# Patient Record
Sex: Female | Born: 1950 | Race: Black or African American | Hispanic: No | Smoking: Former smoker
Health system: Southern US, Community
[De-identification: ages and names within clinical notes are randomized; demographics above are authoritative.]

## PROBLEM LIST (undated history)

## (undated) DIAGNOSIS — E119 Type 2 diabetes mellitus without complications: Secondary | ICD-10-CM

## (undated) DIAGNOSIS — I10 Essential (primary) hypertension: Secondary | ICD-10-CM

## (undated) DIAGNOSIS — D649 Anemia, unspecified: Secondary | ICD-10-CM

## (undated) DIAGNOSIS — T7840XA Allergy, unspecified, initial encounter: Secondary | ICD-10-CM

## (undated) DIAGNOSIS — E079 Disorder of thyroid, unspecified: Secondary | ICD-10-CM

## (undated) HISTORY — DX: Allergy, unspecified, initial encounter: T78.40XA

## (undated) HISTORY — DX: Anemia, unspecified: D64.9

## (undated) HISTORY — DX: Disorder of thyroid, unspecified: E07.9

## (undated) HISTORY — PX: SALIVARY GLAND SURGERY: SHX768

## (undated) HISTORY — PX: COLONOSCOPY: SHX174

---

## 1985-02-09 HISTORY — PX: MYOMECTOMY: SHX85

## 2010-12-22 ENCOUNTER — Emergency Department (HOSPITAL_COMMUNITY)
Admission: EM | Admit: 2010-12-22 | Discharge: 2010-12-22 | Disposition: A | Discharge status: Home / Self Care | Payer: Self-pay | Source: Home / Self Care

## 2010-12-22 ENCOUNTER — Encounter: Payer: Self-pay | Admitting: Cardiology

## 2010-12-22 DIAGNOSIS — N760 Acute vaginitis: Secondary | ICD-10-CM

## 2010-12-22 DIAGNOSIS — J069 Acute upper respiratory infection, unspecified: Secondary | ICD-10-CM

## 2010-12-22 HISTORY — DX: Essential (primary) hypertension: I10

## 2010-12-22 LAB — WET PREP, GENITAL: Yeast Wet Prep HPF POC: NONE SEEN

## 2010-12-22 LAB — POCT URINALYSIS DIP (DEVICE)
Bilirubin Urine: NEGATIVE
Glucose, UA: NEGATIVE mg/dL
Ketones, ur: NEGATIVE mg/dL
Leukocytes, UA: NEGATIVE
Nitrite: NEGATIVE

## 2010-12-22 NOTE — ED Notes (Signed)
Pt c/o sore thorat for the past week with blood tinged white sputum. Pt also report vaginal discharge for the past month.

## 2010-12-22 NOTE — ED Provider Notes (Signed)
History     CSN: [id number] Arrival date & time: 12/22/2010  6:09 PM   None     No chief complaint on file.   (Consider location/radiation/quality/duration/timing/severity/associated sxs/prior treatment) HPI Comments: Pt presents with 2 complaints. She c/o sore throat intermittently x one week. States she has had some nasal congestion and post nasal drainage off and on x 2 weeks. May notice it for a day or two then gone for a couple of days. Notices the sore throat intermittently on the days that she has the post nasal drainage and improves when she clears her throat of the phlegm. The phlegm is clear or white and blood streaked. No cough or fever.  Also states that she has been "leaking" x one mos. Thin clear fluid w/o odor. Uncertain if it is a vaginal discharge. Does not seem to occur with cough, laugh sneeze or urge to void. States when she had similar symptoms in the past she had a UTI. No current dysuria, urgency or frequency.   The history is provided by the patient.    No past medical history on file.  No past surgical history on file.  No family history on file.  History  Substance Use Topics  . Smoking status: Not on file  . Smokeless tobacco: Not on file  . Alcohol Use: Not on file    OB History    No data available      Review of Systems  Constitutional: Negative for fever and chills.  HENT: Positive for congestion, sore throat and postnasal drip. Negative for ear pain, rhinorrhea, sneezing, trouble swallowing and voice change.   Respiratory: Negative for cough, chest tightness and shortness of breath.   Cardiovascular: Negative for chest pain.  Gastrointestinal: Negative for nausea, vomiting, diarrhea and constipation.  Genitourinary: Positive for vaginal discharge. Negative for dysuria, urgency, frequency and vaginal pain.    Allergies  Review of patient's allergies indicates not on file.  Home Medications  No current outpatient prescriptions on  file.  BP 160/85  Pulse 65  Temp(Src) 98.6 F (37 C) (Oral)  Resp 16  SpO2 98%  Physical Exam  Nursing note and vitals reviewed. Constitutional: She appears well-developed and well-nourished. No distress.  HENT:  Head: Normocephalic and atraumatic.  Right Ear: Tympanic membrane, external ear and ear canal normal.  Left Ear: Tympanic membrane, external ear and ear canal normal.  Nose: Nose normal.  Mouth/Throat: Uvula is midline, oropharynx is clear and moist and mucous membranes are normal. No oropharyngeal exudate, posterior oropharyngeal edema or posterior oropharyngeal erythema.  Neck: Neck supple.  Cardiovascular: Normal rate, regular rhythm and normal heart sounds.   Pulmonary/Chest: Effort normal and breath sounds normal. No respiratory distress.  Genitourinary: Vagina normal and uterus normal. There is no rash, tenderness or lesion on the right labia. There is no rash, tenderness or lesion on the left labia. Cervix exhibits no motion tenderness, no discharge and no friability. Right adnexum displays no mass, no tenderness and no fullness. Left adnexum displays no mass, no tenderness and no fullness.  Lymphadenopathy:    She has no cervical adenopathy.  Neurological: She is alert.  Skin: Skin is warm and dry.  Psychiatric: She has a normal mood and affect.    ED Course  Procedures (including critical care time)   Labs Reviewed  POCT URINALYSIS DIPSTICK   No results found.   No diagnosis found.    MDM  UA neg. GC/Chlamydia & wet prep pending. GU exam neg. Discussed with  pt that her symptoms may be due to incontinence and not vaginal discharge. Await vag cultures. If cultures neg and symptoms persists to f/u with PCP.         Melody Comas, [place] 12/22/10 Ernestina Columbia

## 2010-12-23 LAB — GC/CHLAMYDIA PROBE AMP, GENITAL
Chlamydia, DNA Probe: NEGATIVE
GC Probe Amp, Genital: NEGATIVE

## 2010-12-23 NOTE — ED Provider Notes (Signed)
Medical screening examination/treatment/procedure(s) were performed by non-physician practitioner and as supervising physician I was immediately available for consultation/collaboration.   [facility]; MD   Sharin Grave, MD 12/23/10 [phone]

## 2010-12-24 ENCOUNTER — Telehealth (HOSPITAL_COMMUNITY): Payer: Self-pay | Admitting: Physician Assistant

## 2010-12-24 MED ORDER — METRONIDAZOLE 500 MG PO TABS: 500.0000 mg | ORAL_TABLET | Freq: Two times a day (BID) | ORAL | Status: AC

## 2011-01-04 NOTE — Telephone Encounter (Signed)
See 12-24-10 documentation of phone encounter with pt.

## 2011-03-08 ENCOUNTER — Emergency Department (HOSPITAL_COMMUNITY)
Admission: EM | Admit: 2011-03-08 | Discharge: 2011-03-08 | Disposition: A | Discharge status: Home / Self Care | Payer: Self-pay | Source: Home / Self Care | Attending: Emergency Medicine | Admitting: Emergency Medicine

## 2011-03-08 ENCOUNTER — Encounter (HOSPITAL_COMMUNITY): Payer: Self-pay

## 2011-03-08 DIAGNOSIS — S335XXA Sprain of ligaments of lumbar spine, initial encounter: Secondary | ICD-10-CM

## 2011-03-08 DIAGNOSIS — R32 Unspecified urinary incontinence: Secondary | ICD-10-CM

## 2011-03-08 DIAGNOSIS — S39012A Strain of muscle, fascia and tendon of lower back, initial encounter: Secondary | ICD-10-CM

## 2011-03-08 LAB — POCT URINALYSIS DIP (DEVICE)
Glucose, UA: NEGATIVE mg/dL
Hgb urine dipstick: NEGATIVE
Protein, ur: NEGATIVE mg/dL
Specific Gravity, Urine: 1.025 (ref 1.005–1.030)
Urobilinogen, UA: 0.2 mg/dL (ref 0.0–1.0)
pH: 5 (ref 5.0–8.0)

## 2011-03-08 LAB — WET PREP, GENITAL: Trich, Wet Prep: NONE SEEN

## 2011-03-08 MED ORDER — TRAMADOL HCL 50 MG PO TABS: 100.0000 mg | ORAL_TABLET | Freq: Three times a day (TID) | ORAL | Status: AC | PRN

## 2011-03-08 MED ORDER — METHOCARBAMOL 500 MG PO TABS: 500.0000 mg | ORAL_TABLET | Freq: Three times a day (TID) | ORAL | Status: AC

## 2011-03-08 MED ORDER — MELOXICAM 15 MG PO TABS: 15.0000 mg | ORAL_TABLET | Freq: Every day | ORAL | Status: AC

## 2011-03-08 NOTE — ED Notes (Signed)
Pt has low back pain, sorethroat and bladder leakage for two weeks.

## 2011-03-08 NOTE — ED Provider Notes (Signed)
History     CSN: [id number]  Arrival date & time 03/08/11  1130   First MD Initiated Contact with Patient 03/08/11 1145      Chief Complaint  Patient presents with  . Back Pain    (Consider location/radiation/quality/duration/timing/severity/associated sxs/prior treatment) HPI Comments: The patient has had a two-week history of right mid lumbar pain without radiation. She denies any injury. The pain is worse with movement, twisting, and bending, and better with ibuprofen. She denies any radiation into the leg, no numbness, tingling, or muscle weakness. She denies any dysuria, frequency, urgency or hematuria. She's had no abdominal pain or fever.  She also has had a two-week history of constant urinary leakage. She denies any vaginal discharge or itching. She denies any stress incontinence. She's had no dysuria, frequency, or urgency.  Patient is a 61 y.o. female presenting with back pain.  Back Pain  Pertinent negatives include no fever, no numbness, no abdominal pain, no dysuria, no pelvic pain and no weakness.    Past Medical History  Diagnosis Date  . Hypertension     Past Surgical History  Procedure Date  . Cesarean section 1988 1991  . Myomectomy 1987    Family History  Problem Relation Age of Onset  . Hypertension Mother   . Hypertension Father   . Heart disease Other   . Diabetes Maternal Aunt   . Heart disease Maternal Uncle     History  Substance Use Topics  . Smoking status: Current Everyday Smoker    Types: Cigarettes  . Smokeless tobacco: Not on file  . Alcohol Use: Yes     occas    OB History    Grav Para Term Preterm Abortions TAB SAB Ect Mult Living                  Review of Systems  Constitutional: Negative for fever, chills and unexpected weight change.  Gastrointestinal: Negative for nausea, vomiting, abdominal pain and diarrhea.  Genitourinary: Negative for dysuria, urgency, frequency, hematuria, vaginal bleeding, vaginal discharge,  difficulty urinating, genital sores, vaginal pain, menstrual problem, pelvic pain and dyspareunia.  Musculoskeletal: Positive for back pain. Negative for myalgias, joint swelling, arthralgias and gait problem.  Neurological: Negative for weakness and numbness.    Allergies  Review of patient's allergies indicates no known allergies.  Home Medications   Current Outpatient Rx  Name Route Sig Dispense Refill  . LEVOTHYROXINE SODIUM 50 MCG PO TABS Oral Take 50 mcg by mouth daily.      Marland Kitchen LOSARTAN POTASSIUM 50 MG PO TABS Oral Take 50 mg by mouth daily.      . MELOXICAM 15 MG PO TABS Oral Take 1 tablet (15 mg total) by mouth daily. 15 tablet 0  . METHOCARBAMOL 500 MG PO TABS Oral Take 1 tablet (500 mg total) by mouth 3 (three) times daily. 30 tablet 0  . RANITIDINE HCL 150 MG PO TABS Oral Take 150 mg by mouth daily.      . TRAMADOL HCL 50 MG PO TABS Oral Take 2 tablets (100 mg total) by mouth every 8 (eight) hours as needed for pain. 30 tablet 0    BP 143/78  Pulse 72  Temp(Src) 98.4 F (36.9 C) (Oral)  Resp 14  SpO2 99%  Physical Exam  Nursing note and vitals reviewed. Constitutional: She is oriented to person, place, and time. She appears well-developed and well-nourished.  Abdominal: Soft. Bowel sounds are normal. She exhibits no distension, no abdominal bruit, no  pulsatile midline mass and no mass. There is no tenderness. There is no rebound and no guarding.  Genitourinary:       Pelvic exam reveals normal external genitalia. There was no urine leakage from the urethra either spontaneously or with coughing or straining. Vaginal mucosa appears normal without any discharge, drainage, or urinary leakage. The cervix was normal. Uterus was midposition, normal in size and shape and nontender. There were no adnexal masses or tenderness.  Musculoskeletal: She exhibits no edema and no tenderness.       Lumbar back: She exhibits decreased range of motion, tenderness, bony tenderness and pain.  She exhibits no swelling, no edema, no deformity, no spasm and normal pulse.  Neurological: She is alert and oriented to person, place, and time. She has normal reflexes. She displays no atrophy. No sensory deficit. She exhibits normal muscle tone. Coordination and gait normal.       Exam of the back reveals tenderness to palpation in the right mid lumbar area. The back had a full range of motion with slight pain on forward bending. Straight leg raising was negative.  Skin: Skin is warm and dry. No rash noted. She is not diaphoretic.    ED Course  Procedures (including critical care time)  Results for orders placed during the hospital encounter of 03/08/11  POCT URINALYSIS DIP (DEVICE)      Component Value Range   Glucose, UA NEGATIVE  NEGATIVE (mg/dL)   Bilirubin Urine NEGATIVE  NEGATIVE    Ketones, ur NEGATIVE  NEGATIVE (mg/dL)   Specific Gravity, Urine 1.025  1.005 - 1.030    Hgb urine dipstick NEGATIVE  NEGATIVE    pH 5.0  5.0 - 8.0    Protein, ur NEGATIVE  NEGATIVE (mg/dL)   Urobilinogen, UA 0.2  0.0 - 1.0 (mg/dL)   Nitrite NEGATIVE  NEGATIVE    Leukocytes, UA NEGATIVE  NEGATIVE   WET PREP, GENITAL      Component Value Range   Yeast, Wet Prep NONE SEEN  NONE SEEN    Trich, Wet Prep NONE SEEN  NONE SEEN    Clue Cells, Wet Prep FEW (*) NONE SEEN    WBC, Wet Prep HPF POC MANY (*) NONE SEEN      Labs Reviewed  WET PREP, GENITAL - Abnormal; Notable for the following:    Clue Cells, Wet Prep FEW (*)    WBC, Wet Prep HPF POC MANY (*)    All other components within normal limits  POCT URINALYSIS DIP (DEVICE)  POCT URINALYSIS DIPSTICK  GC/CHLAMYDIA PROBE AMP, GENITAL   No results found.   1. Lumbar strain   2. Urinary incontinence       MDM  She appears to have a lumbar strain and was begun on back exercises, meloxicam, tramadol, and Robaxin.  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 She was referred to Dr. Ezzie Dural for further  evaluation.        Roque Lias, MD 03/08/11 [phone]

## 2011-03-09 LAB — GC/CHLAMYDIA PROBE AMP, GENITAL
Chlamydia, DNA Probe: NEGATIVE
GC Probe Amp, Genital: NEGATIVE

## 2012-04-12 ENCOUNTER — Ambulatory Visit: Payer: BC Managed Care – PPO

## 2012-04-12 ENCOUNTER — Ambulatory Visit (INDEPENDENT_AMBULATORY_CARE_PROVIDER_SITE_OTHER): Payer: BC Managed Care – PPO | Admitting: Family Medicine

## 2012-04-12 VITALS — BP 110/77 | HR 67 | Temp 98.1°F | Resp 16 | Ht 64.25 in | Wt 202.0 lb

## 2012-04-12 DIAGNOSIS — M25562 Pain in left knee: Secondary | ICD-10-CM

## 2012-04-12 DIAGNOSIS — M25569 Pain in unspecified knee: Secondary | ICD-10-CM

## 2012-04-12 DIAGNOSIS — S8990XA Unspecified injury of unspecified lower leg, initial encounter: Secondary | ICD-10-CM

## 2012-04-12 DIAGNOSIS — S99929A Unspecified injury of unspecified foot, initial encounter: Secondary | ICD-10-CM

## 2012-04-12 DIAGNOSIS — S8992XA Unspecified injury of left lower leg, initial encounter: Secondary | ICD-10-CM

## 2012-04-12 DIAGNOSIS — J069 Acute upper respiratory infection, unspecified: Secondary | ICD-10-CM

## 2012-04-12 MED ORDER — IPRATROPIUM BROMIDE 0.03 % NA SOLN: 2.0000 | Freq: Two times a day (BID) | NASAL | Status: DC

## 2012-04-12 MED ORDER — FEXOFENADINE HCL 180 MG PO TABS: 180.0000 mg | ORAL_TABLET | Freq: Every day | ORAL | Status: DC

## 2012-04-12 MED ORDER — MELOXICAM 15 MG PO TABS: 15.0000 mg | ORAL_TABLET | Freq: Every day | ORAL | Status: DC

## 2012-04-12 NOTE — Patient Instructions (Addendum)
Begin taking Allegra once daily (this should not make you sleepy), it lasts 24 hours.  Also begin using Atrovent nasal spray twice daily for relief of congestion and post-nasal drainage.  Plenty of fluids and rest.  Continue gargles to help with throat irritation.  Mobic (meloxicam) once daily for knee pain.  Ice and elevate when possible.  If no improvement in the next 2 weeks, please let us know.   Upper Respiratory Infection, Adult An upper respiratory infection (URI) is also sometimes known as the common cold. The upper respiratory tract includes the nose, sinuses, throat, trachea, and bronchi. Bronchi are the airways leading to the lungs. Most people improve within 1 week, but symptoms can last up to 2 weeks. A residual cough may last even longer.  CAUSES Many different viruses can infect the tissues lining the upper respiratory tract. The tissues become irritated and inflamed and often become very moist. Mucus production is also common. A cold is contagious. You can easily spread the virus to others by oral contact. This includes kissing, sharing a glass, coughing, or sneezing. Touching your mouth or nose and then touching a surface, which is then touched by another person, can also spread the virus. SYMPTOMS  Symptoms typically develop 1 to 3 days after you come in contact with a cold virus. Symptoms vary from person to person. They may include:  Runny nose.  Sneezing.  Nasal congestion.  Sinus irritation.  Sore throat.  Loss of voice (laryngitis).  Cough.  Fatigue.  Muscle aches.  Loss of appetite.  Headache.  Low-grade fever. DIAGNOSIS  You might diagnose your own cold based on familiar symptoms, since most people get a cold 2 to 3 times a year. Your caregiver can confirm this based on your exam. Most importantly, your caregiver can check that your symptoms are not due to another disease such as strep throat, sinusitis, pneumonia, asthma, or epiglottitis. Blood tests,  throat tests, and X-rays are not necessary to diagnose a common cold, but they may sometimes be helpful in excluding other more serious diseases. Your caregiver will decide if any further tests are required. RISKS AND COMPLICATIONS  You may be at risk for a more severe case of the common cold if you smoke cigarettes, have chronic heart disease (such as heart failure) or lung disease (such as asthma), or if you have a weakened immune system. The very young and very old are also at risk for more serious infections. Bacterial sinusitis, middle ear infections, and bacterial pneumonia can complicate the common cold. The common cold can worsen asthma and chronic obstructive pulmonary disease (COPD). Sometimes, these complications can require emergency medical care and may be life-threatening. PREVENTION  The best way to protect against getting a cold is to practice good hygiene. Avoid oral or hand contact with people with cold symptoms. Wash your hands often if contact occurs. There is no clear evidence that vitamin C, vitamin E, echinacea, or exercise reduces the chance of developing a cold. However, it is always recommended to get plenty of rest and practice good nutrition. TREATMENT  Treatment is directed at relieving symptoms. There is no cure. Antibiotics are not effective, because the infection is caused by a virus, not by bacteria. Treatment may include:  Increased fluid intake. Sports drinks offer valuable electrolytes, sugars, and fluids.  Breathing heated mist or steam (vaporizer or shower).  Eating chicken soup or other clear broths, and maintaining good nutrition.  Getting plenty of rest.  Using gargles or lozenges for  comfort.  Controlling fevers with ibuprofen or acetaminophen as directed by your caregiver.  Increasing usage of your inhaler if you have asthma. Zinc gel and zinc lozenges, taken in the first 24 hours of the common cold, can shorten the duration and lessen the severity of  symptoms. Pain medicines may help with fever, muscle aches, and throat pain. A variety of non-prescription medicines are available to treat congestion and runny nose. Your caregiver can make recommendations and may suggest nasal or lung inhalers for other symptoms.  HOME CARE INSTRUCTIONS   Only take over-the-counter or prescription medicines for pain, discomfort, or fever as directed by your caregiver.  Use a warm mist humidifier or inhale steam from a shower to increase air moisture. This may keep secretions moist and make it easier to breathe.  Drink enough water and fluids to keep your urine clear or pale yellow.  Rest as needed.  Return to work when your temperature has returned to normal or as your caregiver advises. You may need to stay home longer to avoid infecting others. You can also use a face mask and careful hand washing to prevent spread of the virus. SEEK MEDICAL CARE IF:   After the first few days, you feel you are getting worse rather than better.  You need your caregiver's advice about medicines to control symptoms.  You develop chills, worsening shortness of breath, or brown or red sputum. These may be signs of pneumonia.  You develop yellow or brown nasal discharge or pain in the face, especially when you bend forward. These may be signs of sinusitis.  You develop a fever, swollen neck glands, pain with swallowing, or white areas in the back of your throat. These may be signs of strep throat. SEEK IMMEDIATE MEDICAL CARE IF:   You have a fever.  You develop severe or persistent headache, ear pain, sinus pain, or chest pain.  You develop wheezing, a prolonged cough, cough up blood, or have a change in your usual mucus (if you have chronic lung disease).  You develop sore muscles or a stiff neck. Document Released: 07/22/2000 Document Revised: 04/20/2011 Document Reviewed: 05/30/2010 [facility] Patient Information 2013 [place], [place].

## 2012-04-12 NOTE — Progress Notes (Signed)
Xray read and patient discussed with Ms. Egan. Agree with assessment and plan of care per her note.   

## 2012-04-12 NOTE — Progress Notes (Signed)
Subjective:    Patient ID: Alexandria Walsh, female    DOB: 1950/08/14, 62 y.o.   MRN: [id number]  HPI   Alexandria Walsh is a very pleasant 62 yr old female here with two concerns.  (1)  URI symptoms for 3-4 days.  Experiencing sore throat, runny/stuffy nose, and sneezing for 3-4 days.  No cough or headache.  Ears feel a little full sometimes.  Some sinus pressure and post-nasal drainage.  No fever or chills.  She has been using Catering manager for symptoms with little relief.    (2)  "I fell and hurt my leg about a week ago"  States she was walking and tripped over someone's foot.  Landed on the left knee cap.  Intermittent swelling.  Has been wearing a brace occasionally.  She is able to bear weight and walk, but the knee feels "tight", esp when she first stands up.  Elevating when possible but not using ice.  When asked to localize pain, she points to the patella.  States she no better than after the injury, but also does not feel like she is worsening.  Taking Aleve intermittently for pain relief.      Review of Systems  Constitutional: Negative for fever and chills.  HENT: Positive for congestion, sore throat, rhinorrhea, sneezing and sinus pressure. Negative for ear pain.   Respiratory: Negative for cough, shortness of breath and wheezing.   Cardiovascular: Negative.   Gastrointestinal: Negative.   Musculoskeletal: Positive for arthralgias (left knee).  Skin: Negative.   Neurological: Negative.        Objective:   Physical Exam  Vitals reviewed. Constitutional: She is oriented to person, place, and time. She appears well-developed and well-nourished. No distress.  HENT:  Head: Normocephalic and atraumatic.  Right Ear: Ear canal normal. Tympanic membrane is injected.  Left Ear: Tympanic membrane and ear canal normal.  Nose: Mucosal edema and rhinorrhea present. Right sinus exhibits no maxillary sinus tenderness and no frontal sinus tenderness. Left sinus exhibits no maxillary sinus  tenderness and no frontal sinus tenderness.  Mouth/Throat: Uvula is midline, oropharynx is clear and moist and mucous membranes are normal.  Eyes: Conjunctivae are normal. No scleral icterus.  Neck: Neck supple.  Cardiovascular: Normal rate, regular rhythm and normal heart sounds.  Exam reveals no gallop and no friction rub.   No murmur heard. Pulmonary/Chest: Effort normal and breath sounds normal. She has no wheezes. She has no rales.  Musculoskeletal:       Right knee: Normal.       Left knee: She exhibits swelling and effusion. She exhibits normal range of motion, no ecchymosis, no deformity, no erythema, normal alignment, no LCL laxity, normal patellar mobility, no bony tenderness and no MCL laxity.       Right ankle: Normal.       Left ankle: Normal.       Right lower leg: Normal.       Left lower leg: Normal. She exhibits no tenderness, no swelling and no edema.  Lymphadenopathy:    She has no cervical adenopathy.  Neurological: She is alert and oriented to person, place, and time.  Skin: Skin is warm and dry.  Psychiatric: She has a normal mood and affect. Her behavior is normal.     Filed Vitals:   04/12/12 1445  BP: 110/77  Pulse: 67  Temp: 98.1 F (36.7 C)  Resp: 16     [facility] reading (PRIMARY) by  Dr. Neva Seat - no fracture; narrowing of  the medial joint space; questionable soft tissue density in the posterior knee      Assessment & Plan:  (1) Knee pain, left - Plan: DG Knee Complete 4 Views Right, meloxicam (MOBIC) 15 MG tablet  -- Pt s/p fall on left flexed knee approx 1 wk ago.  X-ray of knee shows no fracture.  Will continue conservative treatment.  Mobic once daily for at least the next 2 weeks.  Ice and elevation when possible.  Activity as tolerated.  If no improvement in 2-3 weeks, will consider PT or ortho eval.  Pt to RTC if worsening.  (2) Knee injury, left, initial encounter - Plan: DG Knee Complete 4 Views Right  -- See above  (3) Acute upper  respiratory infections of unspecified site - Plan: fexofenadine (ALLEGRA) 180 MG tablet, ipratropium (ATROVENT) 0.03 % nasal spray  --  Pt with 3-4 days of URI symptoms, suspect viral etiology.  VSS, afebrile, lungs CTA, no sinus tenderness.  Will treat symptoms with Allegra and Atrovent.  Plenty of fluids and rest.  Discussed RTC precautions.  Pt expressed understanding and is in agreement with this plan.

## 2013-02-05 ENCOUNTER — Ambulatory Visit: Payer: BC Managed Care – PPO

## 2013-02-05 ENCOUNTER — Ambulatory Visit (INDEPENDENT_AMBULATORY_CARE_PROVIDER_SITE_OTHER): Payer: BC Managed Care – PPO | Admitting: Family Medicine

## 2013-02-05 VITALS — BP 116/82 | HR 69 | Temp 98.8°F | Resp 16 | Ht 64.0 in | Wt 197.0 lb

## 2013-02-05 DIAGNOSIS — R0602 Shortness of breath: Secondary | ICD-10-CM

## 2013-02-05 DIAGNOSIS — M79605 Pain in left leg: Secondary | ICD-10-CM

## 2013-02-05 DIAGNOSIS — M79609 Pain in unspecified limb: Secondary | ICD-10-CM

## 2013-02-05 DIAGNOSIS — J069 Acute upper respiratory infection, unspecified: Secondary | ICD-10-CM

## 2013-02-05 LAB — POCT CBC
Granulocyte percent: 51.9 %G (ref 37–80)
HCT, POC: 44.3 % (ref 37.7–47.9)
MCV: 95.6 fL (ref 80–97)
POC LYMPH PERCENT: 41.2 %L (ref 10–50)
RDW, POC: 13.7 %
WBC: 5.9 10*3/uL (ref 4.6–10.2)

## 2013-02-05 MED ORDER — IPRATROPIUM BROMIDE 0.03 % NA SOLN: 2.0000 | Freq: Two times a day (BID) | NASAL | Status: DC

## 2013-02-05 MED ORDER — DICLOFENAC SODIUM 75 MG PO TBEC: 75.0000 mg | DELAYED_RELEASE_TABLET | Freq: Two times a day (BID) | ORAL | Status: DC

## 2013-02-05 NOTE — Patient Instructions (Signed)
Take Claritin-D (loratadine D.) 1 each evening to try and keep you had open. This is non-prescription  Use the ipratropium nose spray 2 sprays in each nostril at bedtime. Can use again in 6 hours if needed.  Use a humidifier or cool mist vaporizer by your bed side  Diclofenac one twice daily for the leg pain. This is possibly caused by a Baker's cyst. If he keeps giving problems we'll need to referred to an orthopedic doctor. We might get an ultrasound of your leg first.  Return if worse or not improving.  If the breathing is at all worse go to the emergency room.

## 2013-02-05 NOTE — Progress Notes (Signed)
Subjective: Patient has been having a couple of problems. She's been having pain behind the calf of her left knee. There is also some tightness and pain in the upper and lower parts of the leg, but it is mostly behind the knee is bothering her. This been going on for couple of weeks. Knows of no particular trauma. Also she is having problems breathing at night. She has to prop up to breathe. It seems that this is mostly because she cannot breathe through her nose. It does not seem to be a chest septum.  Objective: Pleasant lady in no major distress. Nose congested. TMs normal. Throat clear. Neck supple without nodes. No JVD. Chest clear to auscultation. Heart regular without murmurs. Abdomen soft nontender. She's tender on the knee of her left leg in the popliteal fossa. There is a possible Baker's cyst though it is only slightly larger than the other day. No significant ankle edema. Negative Homans sign. No medial thigh tenderness the she feels tight there.  She also complains of a tingling numbness pain into her right hand intermittently. This appears grossly normal. I'm not sure what that is from.  Results for orders placed in visit on 02/05/13  POCT CBC      Result Value Range   WBC 5.9  4.6 - 10.2 K/uL   Lymph, poc 2.4  0.6 - 3.4   POC LYMPH PERCENT 41.2  10 - 50 %L   MID (cbc) 0.4  0 - 0.9   POC MID % 6.9  0 - 12 %M   POC Granulocyte 3.1  2 - 6.9   Granulocyte percent 51.9  37 - 80 %G   RBC 4.63  4.04 - 5.48 M/uL   Hemoglobin 13.4  12.2 - 16.2 g/dL   HCT, POC 08.6  57.8 - 47.9 %   MCV 95.6  80 - 97 fL   MCH, POC 28.9  27 - 31.2 pg   MCHC 30.2 (*) 31.8 - 35.4 g/dL   RDW, POC 46.9     Platelet Count, POC 221  142 - 424 K/uL   MPV 10.2  0 - 99.8 fL   [facility] reading (PRIMARY) by  Dr. Alwyn Ren normax cxr  Assessment: Leg pain from possible Baker's cyst. We will check a d-dimer. This been going on for a while, but if it is elevated we'll need to get an ultrasound. If the pain continues to  persist will refer to orthopedist If the right hand symptoms get worse she should return for recheck Nasal congestion  Plan: Atrovent nasal Diclofenac for the leg Return if worse

## 2013-10-24 ENCOUNTER — Ambulatory Visit (INDEPENDENT_AMBULATORY_CARE_PROVIDER_SITE_OTHER): Payer: BC Managed Care – PPO | Admitting: Physician Assistant

## 2013-10-24 VITALS — BP 112/68 | HR 70 | Temp 97.8°F | Resp 18 | Ht 63.25 in | Wt 193.4 lb

## 2013-10-24 DIAGNOSIS — J029 Acute pharyngitis, unspecified: Secondary | ICD-10-CM

## 2013-10-24 DIAGNOSIS — Z9109 Other allergy status, other than to drugs and biological substances: Secondary | ICD-10-CM

## 2013-10-24 DIAGNOSIS — E039 Hypothyroidism, unspecified: Secondary | ICD-10-CM | POA: Insufficient documentation

## 2013-10-24 DIAGNOSIS — K219 Gastro-esophageal reflux disease without esophagitis: Secondary | ICD-10-CM | POA: Insufficient documentation

## 2013-10-24 DIAGNOSIS — I10 Essential (primary) hypertension: Secondary | ICD-10-CM | POA: Insufficient documentation

## 2013-10-24 LAB — POCT RAPID STREP A (OFFICE): Rapid Strep A Screen: NEGATIVE

## 2013-10-24 MED ORDER — GUAIFENESIN ER 1200 MG PO TB12: 1.0000 | ORAL_TABLET | Freq: Two times a day (BID) | ORAL | Status: AC

## 2013-10-24 MED ORDER — FLUTICASONE PROPIONATE 50 MCG/ACT NA SUSP: 2.0000 | Freq: Every day | NASAL | Status: DC

## 2013-10-24 NOTE — Progress Notes (Signed)
   Subjective:    Patient ID: Alexandria Walsh, female    DOB: 02-Aug-1950, 63 y.o.   MRN: [id number]  HPI Pt presents to clinic with sore throat and slight congestion for the last week or so.  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  The sore throat is worse in the am but does not completely resolve during the day.  She has a cough but it is dry and expect in the morning when the mucus that she coughs up is thick.  She has h/o fall allergies.  OTC meds - delsym, dayquil Sick contacts - people at work are sick  Review of Systems  HENT: Positive for congestion, ear pain (slight on the left), postnasal drip and sore throat (slightly improved after lunch but never resolves throughout the day). Negative for rhinorrhea.   Respiratory: Positive for cough (productivein the am).   Gastrointestinal: Negative for nausea, vomiting and diarrhea.  Musculoskeletal: Negative for myalgias.  Neurological: Negative for headaches.       Objective:   Physical Exam  Vitals reviewed. Constitutional: She is oriented to person, place, and time. She appears well-developed and well-nourished.  HENT:  Head: Normocephalic and atraumatic.  Right Ear: Hearing, tympanic membrane, external ear and ear canal normal.  Left Ear: Hearing, tympanic membrane, external ear and ear canal normal.  Nose: Mucosal edema (pale and swollen) present.  Mouth/Throat: Uvula is midline, oropharynx is clear and moist and mucous membranes are normal.  Eyes: Conjunctivae are normal.  Neck: Normal range of motion.  Cardiovascular: Normal rate, regular rhythm and normal heart sounds.   No murmur heard. Pulmonary/Chest: Effort normal and breath sounds normal. She has no wheezes.  Lymphadenopathy:       Head (right side): No tonsillar and no occipital adenopathy present.       Head (left side): No tonsillar and no occipital adenopathy present.    She has cervical adenopathy.       Right cervical: Superficial cervical (mild  TTP) adenopathy present.       Left cervical: Superficial cervical (mild TTP bilaterally) adenopathy present.       Right: No supraclavicular adenopathy present.       Left: No supraclavicular adenopathy present.  Neurological: She is alert and oriented to person, place, and time.  Skin: Skin is warm and dry.  Psychiatric: She has a normal mood and affect. Her behavior is normal. Judgment and thought content normal.   Results for orders placed in visit on 10/24/13  POCT RAPID STREP A (OFFICE)      Result Value Ref Range   Rapid Strep A Screen Negative  Negative        Assessment & Plan:  Sore throat - Plan: POCT rapid strep A, Guaifenesin (MUCINEX MAXIMUM STRENGTH) 1200 MG TB12  Environmental allergies - Plan: fluticasone (FLONASE) 50 MCG/ACT nasal spray  Pt is mostly suffering from uncontrolled allergies.  We will add Flonase and she will use Atrovent prn for runny nose.  She will increase her fluid intake.  Windell Hummingbird PA-C  [facility] 10/24/2013 11:07 AM

## 2014-03-18 ENCOUNTER — Ambulatory Visit (INDEPENDENT_AMBULATORY_CARE_PROVIDER_SITE_OTHER): Payer: Self-pay

## 2014-03-18 ENCOUNTER — Ambulatory Visit (INDEPENDENT_AMBULATORY_CARE_PROVIDER_SITE_OTHER): Payer: Self-pay | Admitting: Internal Medicine

## 2014-03-18 VITALS — BP 126/74 | HR 50 | Temp 97.7°F | Resp 19 | Ht 63.5 in | Wt 197.0 lb

## 2014-03-18 DIAGNOSIS — M25562 Pain in left knee: Secondary | ICD-10-CM

## 2014-03-18 DIAGNOSIS — L03116 Cellulitis of left lower limb: Secondary | ICD-10-CM

## 2014-03-18 MED ORDER — DOXYCYCLINE HYCLATE 100 MG PO TABS: 100.0000 mg | ORAL_TABLET | Freq: Two times a day (BID) | ORAL | Status: DC

## 2014-03-18 NOTE — Progress Notes (Addendum)
Subjective:  This chart was scribed for Alexandria Lin, MD by Dellis Filbert, ED Scribe at [facility].The patient was seen in exam room 14 and the patient's care was started at 11:36 AM.   Patient ID: Alexandria Walsh, female    DOB: 1950-09-18, 64 y.o.   MRN: [id number] Chief Complaint  Patient presents with  . Leg Pain    left leg, x 1 week, pain while walking, swelling   HPI  HPI Comments: Alexandria Walsh is a 64 y.o. female who presents to [facility] complaining of left leg pain, onset 1 week ago. Feels tight like a balloon. Last Sunday she developed left leg pain shortly after church. She wore heels and she typically wears flats to church. Unsure of trauma. To relieve the pain she wrapped, iced and took ibuprofen. Despite this she has continued with swelling anteriorly along the lower leg . The pain is affecting her gait, she currently walks with a limp and has noticed some redness and swelling. No knee pain.  Patient Active Problem List   Diagnosis Date Noted  . HTN (hypertension) 10/24/2013  . Unspecified hypothyroidism 10/24/2013  . Esophageal reflux 10/24/2013   Past Medical History  Diagnosis Date  . Hypertension    Past Surgical History  Procedure Laterality Date  . Cesarean section  1988 1991  . Myomectomy  1987   No Known Allergies Prior to Admission medications   Medication Sig Start Date End Date Taking? Authorizing Provider  amLODipine (NORVASC) 10 MG tablet Take 10 mg by mouth daily.   Yes Historical Provider, MD  calcium-vitamin D (OSCAL WITH D) 500-200 MG-UNIT per tablet Take 1 tablet by mouth.   Yes Historical Provider, MD  fluticasone (FLONASE) 50 MCG/ACT nasal spray Place 2 sprays into both nostrils daily. 10/24/13  Yes Mancel Bale, PA-C  hydrochlorothiazide (HYDRODIURIL) 50 MG tablet Take 50 mg by mouth daily.   Yes Historical Provider, MD  ipratropium (ATROVENT) 0.03 % nasal spray Place 2 sprays into the nose 2 (two) times daily. 02/05/13  Yes  Posey Boyer, MD  levothyroxine (SYNTHROID, LEVOTHROID) 50 MCG tablet Take 50 mcg by mouth daily.     Yes Historical Provider, MD  omeprazole (PRILOSEC) 20 MG capsule Take 20 mg by mouth daily.   Yes Historical Provider, MD   Review of Systems  Musculoskeletal: Positive for joint swelling, arthralgias and gait problem.       Objective:  BP 126/74 mmHg  Pulse 50  Temp(Src) 97.7 F (36.5 C) (Oral)  Resp 19  Ht 5' 3.5" (1.613 m)  Wt 197 lb (89.359 kg)  BMI 34.35 kg/m2  SpO2 98%  Physical Exam  Constitutional: She is oriented to person, place, and time. She appears well-developed and well-nourished. No distress.  HENT:  Head: Normocephalic and atraumatic.  Eyes: Pupils are equal, round, and reactive to light.  Neck: Normal range of motion.  Cardiovascular: Normal rate and regular rhythm.   Pulmonary/Chest: Effort normal. No respiratory distress.  Musculoskeletal: Normal range of motion.  She is swollen along the anterior tibia the lower half of the left extremity with a 2 cm x 2 cm area of mild redness with some heat. It's tender to palpation all around this area There are some bony tenderness there The cath is intact without swelling masses defects or tender cords Dorsiflexion of the foot intact without pain Knee exam is normal  Neurological: She is alert and oriented to person, place, and time.  Skin: Skin  is warm and dry.  Psychiatric: She has a normal mood and affect. Her behavior is normal.  Nursing note and vitals reviewed. [facility] reading (PRIMARY) by  Dr. Laney Pastor no bony abnormalities      Assessment & Plan:  Pain in leg, left - Plan: DG Tibia/Fibula Left  Cellulitis of leg, left  probably secondary to contusion  Elevate/no work off feet for 2 days/heat Doxycycline 100 mg twice a day for 7 days Follow-up one week if not well  I have completed the patient encounter in its entirety as documented by the scribe, with editing by me where necessary. Arlayne Liggins P.  Laney Pastor, M.D.

## 2015-09-06 ENCOUNTER — Ambulatory Visit (INDEPENDENT_AMBULATORY_CARE_PROVIDER_SITE_OTHER): Payer: BLUE CROSS/BLUE SHIELD | Admitting: Urgent Care

## 2015-09-06 VITALS — BP 138/88 | HR 60 | Temp 98.4°F | Resp 17 | Ht 65.0 in | Wt 192.0 lb

## 2015-09-06 DIAGNOSIS — R202 Paresthesia of skin: Secondary | ICD-10-CM | POA: Diagnosis not present

## 2015-09-06 DIAGNOSIS — M79605 Pain in left leg: Secondary | ICD-10-CM | POA: Diagnosis not present

## 2015-09-06 DIAGNOSIS — M79604 Pain in right leg: Secondary | ICD-10-CM

## 2015-09-06 DIAGNOSIS — R7303 Prediabetes: Secondary | ICD-10-CM | POA: Diagnosis not present

## 2015-09-06 DIAGNOSIS — R200 Anesthesia of skin: Secondary | ICD-10-CM

## 2015-09-06 DIAGNOSIS — I10 Essential (primary) hypertension: Secondary | ICD-10-CM | POA: Diagnosis not present

## 2015-09-06 DIAGNOSIS — E669 Obesity, unspecified: Secondary | ICD-10-CM

## 2015-09-06 DIAGNOSIS — N3090 Cystitis, unspecified without hematuria: Secondary | ICD-10-CM

## 2015-09-06 DIAGNOSIS — R350 Frequency of micturition: Secondary | ICD-10-CM | POA: Diagnosis not present

## 2015-09-06 LAB — POCT URINALYSIS DIP (MANUAL ENTRY)
BILIRUBIN UA: NEGATIVE
BILIRUBIN UA: NEGATIVE
Glucose, UA: NEGATIVE
Nitrite, UA: NEGATIVE
PH UA: 7.5
SPEC GRAV UA: 1.015
Urobilinogen, UA: 4

## 2015-09-06 LAB — POCT CBC
Granulocyte percent: 56.4 %G (ref 37–80)
HCT, POC: 39.9 % (ref 37.7–47.9)
HEMOGLOBIN: 13.8 g/dL (ref 12.2–16.2)
LYMPH, POC: 2.4 (ref 0.6–3.4)
MCH, POC: 30.9 pg (ref 27–31.2)
MCHC: 34.7 g/dL (ref 31.8–35.4)
MCV: 89 fL (ref 80–97)
MID (cbc): 0.3 (ref 0–0.9)
MPV: 8.3 fL (ref 0–99.8)
PLATELET COUNT, POC: 158 10*3/uL (ref 142–424)
POC Granulocyte: 3.5 (ref 2–6.9)
POC LYMPH %: 38.3 % (ref 10–50)
POC MID %: 5.3 %M (ref 0–12)
RBC: 4.48 M/uL (ref 4.04–5.48)
RDW, POC: 14.2 %
WBC: 6.2 10*3/uL (ref 4.6–10.2)

## 2015-09-06 LAB — POCT GLYCOSYLATED HEMOGLOBIN (HGB A1C): HEMOGLOBIN A1C: 6.2

## 2015-09-06 LAB — POC MICROSCOPIC URINALYSIS (UMFC): MUCUS RE: ABSENT

## 2015-09-06 MED ORDER — NITROFURANTOIN MONOHYD MACRO 100 MG PO CAPS: 100.0000 mg | ORAL_CAPSULE | Freq: Two times a day (BID) | ORAL | 0 refills | Status: DC

## 2015-09-06 MED ORDER — NAPROXEN SODIUM 550 MG PO TABS: 550.0000 mg | ORAL_TABLET | Freq: Two times a day (BID) | ORAL | 1 refills | Status: DC

## 2015-09-06 MED ORDER — AMLODIPINE BESYLATE 5 MG PO TABS: 5.0000 mg | ORAL_TABLET | Freq: Every day | ORAL | 3 refills | Status: DC

## 2015-09-06 MED ORDER — METFORMIN HCL 500 MG PO TABS: 500.0000 mg | ORAL_TABLET | Freq: Every day | ORAL | 3 refills | Status: AC

## 2015-09-06 NOTE — Progress Notes (Signed)
MRN: [id number] DOB: 1951-01-21  Subjective:   Alexandria Walsh is a 65 y.o. female presenting for chief complaint of Leg Pain and Other (urine frequency )  Urinary Frequency - Reports several month history of urinary frequency, nocturia every hour in the past 2 weeks, has much darker urine. Admits polydipsia. Has not tried medications. Diet is not healthy, does not hydrate well, drinks juice, sweet tea. Denies fever, n/v, abdominal pain, flank pain, pelvic pressure, dysuria, hematuria, vaginal irritation, genital rashes.   Leg Pain - Reports 2 week history of left leg cramping between her inner thigh and calves bilaterally. Admits history of varicose veins, does not use compression stockings.   [name] has a current medication list which includes the following prescription(s): levothyroxine, meloxicam, and montelukast. Also has No Known Allergies.  [name]  has a past medical history of Hypertension. Also  has a past surgical history that includes Cesarean section (1988 1991) and Myomectomy (1987).  Objective:   Vitals: BP 138/88 (BP Location: Right Arm, Patient Position: Sitting, Cuff Size: Normal)   Pulse 60   Temp 98.4 F (36.9 C) (Oral)   Resp 17   Ht 5\' 5"  (1.651 m)   Wt 192 lb (87.1 kg)   SpO2 98%   BMI 31.95 kg/m   Physical Exam  Constitutional: She is oriented to person, place, and time. She appears well-developed and well-nourished.  HENT:  Mouth/Throat: Oropharynx is clear and moist.  Cardiovascular: Normal rate, regular rhythm and intact distal pulses.  Exam reveals no gallop and no friction rub.   No murmur heard. Pulmonary/Chest: No respiratory distress. She has no wheezes. She has no rales.  Abdominal: Soft. Bowel sounds are normal. She exhibits no distension and no mass. There is no tenderness. There is no guarding.  Musculoskeletal: She exhibits edema (trace up to mid calves bilaterally).       Right lower leg: She exhibits no tenderness, no bony tenderness, no  swelling, no edema, no deformity and no laceration.       Left lower leg: She exhibits no tenderness, no bony tenderness, no swelling, no edema, no deformity and no laceration.       Legs: Neurological: She is alert and oriented to person, place, and time.  Skin: Skin is warm and dry.   Results for orders placed or performed in visit on 09/06/15 (from the past 24 hour(s))  POCT Microscopic Urinalysis ([facility])     Status: Abnormal   Collection Time: 09/06/15  5:21 PM  Result Value Ref Range   WBC,UR,HPF,POC Few (A) None WBC/hpf   RBC,UR,HPF,POC Few (A) None RBC/hpf   Bacteria Few (A) None, Too numerous to count   Mucus Absent Absent   Epithelial Cells, UR Per Microscopy Few (A) None, Too numerous to count cells/hpf  POCT urinalysis dipstick     Status: Abnormal   Collection Time: 09/06/15  5:22 PM  Result Value Ref Range   Color, UA yellow yellow   Clarity, UA clear clear   Glucose, UA negative negative   Bilirubin, UA negative negative   Ketones, POC UA negative negative   Spec Grav, UA 1.015    Blood, UA trace-lysed (A) negative   pH, UA 7.5    Protein Ur, POC trace (A) negative   Urobilinogen, UA 4.0    Nitrite, UA Negative Negative   Leukocytes, UA small (1+) (A) Negative  POCT CBC     Status: None   Collection Time: 09/06/15  5:38 PM  Result Value Ref  Range   WBC 6.2 4.6 - 10.2 K/uL   Lymph, poc 2.4 0.6 - 3.4   POC LYMPH PERCENT 38.3 10 - 50 %L   MID (cbc) 0.3 0 - 0.9   POC MID % 5.3 0 - 12 %M   POC Granulocyte 3.5 2 - 6.9   Granulocyte percent 56.4 37 - 80 %G   RBC 4.48 4.04 - 5.48 M/uL   Hemoglobin 13.8 12.2 - 16.2 g/dL   HCT, POC 39.9 37.7 - 47.9 %   MCV 89.0 80 - 97 fL   MCH, POC 30.9 27 - 31.2 pg   MCHC 34.7 31.8 - 35.4 g/dL   RDW, POC 14.2 %   Platelet Count, POC 158 142 - 424 K/uL   MPV 8.3 0 - 99.8 fL  POCT glycosylated hemoglobin (Hb A1C)     Status: None   Collection Time: 09/06/15  5:50 PM  Result Value Ref Range   Hemoglobin A1C 6.2    Assessment  and Plan :   1. Cystitis 2. Urinary frequency - Will cover for cystitis given recent change in urinary frequency, urine culture pending. Start Macrobid, advised aggressive hydration. RTC in 1 week if no improvement.  3. Obesity 4. Pre-diabetes - Start Metformin, advised significantly dietary modifications. RTC in 3 months for f/u.  5. Leg pain, bilateral 6. 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 Advised use of Anaprox, compression stockings. Counseled on signs of DVT, patient will rtc if this problem develops. Stop meloxicam.  7. Essential hypertension - Restart amlodipine, dietary modifications as above. Labs pending.  [name], PA-C [facility] [phone] 09/06/2015 5:02 PM

## 2015-09-06 NOTE — Patient Instructions (Addendum)
Diabetes Mellitus and Food It is important for you to manage your blood sugar (glucose) level. Your blood glucose level can be greatly affected by what you eat. Eating healthier foods in the appropriate amounts throughout the day at about the same time each day will help you control your blood glucose level. It can also help slow or prevent worsening of your diabetes mellitus. Healthy eating may even help you improve the level of your blood pressure and reach or maintain a healthy weight.  General recommendations for healthful eating and cooking habits include:  Eating meals and snacks regularly. Avoid going long periods of time without eating to lose weight.  Eating a diet that consists mainly of plant-based foods, such as fruits, vegetables, nuts, legumes, and whole grains.  Using low-heat cooking methods, such as baking, instead of high-heat cooking methods, such as deep frying. Work with your dietitian to make sure you understand how to use the Nutrition Facts information on food labels. HOW CAN FOOD AFFECT ME? Carbohydrates Carbohydrates affect your blood glucose level more than any other type of food. Your dietitian will help you determine how many carbohydrates to eat at each meal and teach you how to count carbohydrates. Counting carbohydrates is important to keep your blood glucose at a healthy level, especially if you are using insulin or taking certain medicines for diabetes mellitus. Alcohol Alcohol can cause sudden decreases in blood glucose (hypoglycemia), especially if you use insulin or take certain medicines for diabetes mellitus. Hypoglycemia can be a life-threatening condition. Symptoms of hypoglycemia (sleepiness, dizziness, and disorientation) are similar to symptoms of having too much alcohol.  If your health care provider has given you approval to drink alcohol, do so in moderation and use the following guidelines:  Women should not have more than one drink per day, and men  should not have more than two drinks per day. One drink is equal to:  12 oz of beer.  5 oz of wine.  1 oz of hard liquor.  Do not drink on an empty stomach.  Keep yourself hydrated. Have water, diet soda, or unsweetened iced tea.  Regular soda, juice, and other mixers might contain a lot of carbohydrates and should be counted. WHAT FOODS ARE NOT RECOMMENDED? As you make food choices, it is important to remember that all foods are not the same. Some foods have fewer nutrients per serving than other foods, even though they might have the same number of calories or carbohydrates. It is difficult to get your body what it needs when you eat foods with fewer nutrients. Examples of foods that you should avoid that are high in calories and carbohydrates but low in nutrients include:  Trans fats (most processed foods list trans fats on the Nutrition Facts label).  Regular soda.  Juice.  Candy.  Sweets, such as cake, pie, doughnuts, and cookies.  Fried foods. WHAT FOODS CAN I EAT? Eat nutrient-rich foods, which will nourish your body and keep you healthy. The food you should eat also will depend on several factors, including:  The calories you need.  The medicines you take.  Your weight.  Your blood glucose level.  Your blood pressure level.  Your cholesterol level. You should eat a variety of foods, including:  Protein.  Lean cuts of meat.  Proteins low in saturated fats, such as fish, egg whites, and beans. Avoid processed meats.  Fruits and vegetables.  Fruits and vegetables that may help control blood glucose levels, such as apples, mangoes, and   yams.  Dairy products.  Choose fat-free or low-fat dairy products, such as milk, yogurt, and cheese.  Grains, bread, pasta, and rice.  Choose whole grain products, such as multigrain bread, whole oats, and brown rice. These foods may help control blood pressure.  Fats.  Foods containing healthful fats, such as nuts,  avocado, olive oil, canola oil, and fish. DOES EVERYONE WITH DIABETES MELLITUS HAVE THE SAME MEAL PLAN? Because every person with diabetes mellitus is different, there is not one meal plan that works for everyone. It is very important that you meet with a dietitian who will help you create a meal plan that is just right for you.   This information is not intended to replace advice given to you by your health care provider. Make sure you discuss any questions you have with your health care provider.   Document Released: 10/23/2004 Document Revi    Urinary Tract Infection Urinary tract infections (UTIs) can develop anywhere along your urinary tract. Your urinary tract is your body's drainage system for removing wastes and extra water. Your urinary tract includes two kidneys, two ureters, a bladder, and a urethra. Your kidneys are a pair of bean-shaped organs. Each kidney is about the size of your fist. They are located below your ribs, one on each side of your spine. CAUSES Infections are caused by microbes, which are microscopic organisms, including fungi, viruses, and bacteria. These organisms are so small that they can only be seen through a microscope. Bacteria are the microbes that most commonly cause UTIs. SYMPTOMS  Symptoms of UTIs may vary by age and gender of the patient and by the location of the infection. Symptoms in young women typically include a frequent and intense urge to urinate and a painful, burning feeling in the bladder or urethra during urination. Older women and men are more likely to be tired, shaky, and weak and have muscle aches and abdominal pain. A fever may mean the infection is in your kidneys. Other symptoms of a kidney infection include pain in your back or sides below the ribs, nausea, and vomiting. DIAGNOSIS To diagnose a UTI, your caregiver will ask you about your symptoms. Your caregiver will also ask you to provide a urine sample. The urine sample will be tested  for bacteria and white blood cells. White blood cells are made by your body to help fight infection. TREATMENT  Typically, UTIs can be treated with medication. Because most UTIs are caused by a bacterial infection, they usually can be treated with the use of antibiotics. The choice of antibiotic and length of treatment depend on your symptoms and the type of bacteria causing your infection. HOME CARE INSTRUCTIONS  If you were prescribed antibiotics, take them exactly as your caregiver instructs you. Finish the medication even if you feel better after you have only taken some of the medication.  Drink enough water and fluids to keep your urine clear or pale yellow.  Avoid caffeine, tea, and carbonated beverages. They tend to irritate your bladder.  Empty your bladder often. Avoid holding urine for long periods of time.  Empty your bladder before and after sexual intercourse.  After a bowel movement, women should cleanse from front to back. Use each tissue only once. SEEK MEDICAL CARE IF:   You have back pain.  You develop a fever.  Your symptoms do not begin to resolve within 3 days. SEEK IMMEDIATE MEDICAL CARE IF:   You have severe back pain or lower abdominal pain.  You  develop chills.  You have nausea or vomiting.  You have continued burning or discomfort with urination. MAKE SURE YOU:   Understand these instructions.  Will watch your condition.  Will get help right away if you are not doing well or get worse.   This information is not intended to replace advice given to you by your health care provider. Make sure you discuss any questions you have with your health care provider.   Document Released: 11/05/2004 Document Revised: 10/17/2014 Document Reviewed: 03/06/2011 Elsevier Interactive Patient Education 2016 [place]. sed: 02/16/2014 Document Reviewed: 12/23/2012 Elsevier Interactive Patient Education 2016 [employer].        IF you received an x-ray  today, you will receive an invoice from [facility]. Please contact [facility] at [phone] with questions or concerns regarding your invoice.   IF you received labwork today, you will receive an invoice from [employer]. Please contact [facility] at [phone] with questions or concerns regarding your invoice.   Our billing staff will not be able to assist you with questions regarding bills from these companies.  You will be contacted with the lab results as soon as they are available. The fastest way to get your results is to activate your My Chart account. Instructions are located on the last page of this paperwork. If you have not heard from [place] regarding the results in 2 weeks, please contact this office.   We recommend that you schedule a mammogram for breast cancer screening. Typically, you do not need a referral to do this. Please contact a local imaging center to schedule your mammogram.  [facility] - [phone]  *ask for the Radiology Department [facility]) - [phone] or [phone]  [facility] - [phone] [place] [phone] [facility] - [phone]  *ask for the [facility] - [phone]  *ask for the Radiology Department [facility] - [phone]  *ask for the [place] - [phone]

## 2015-09-07 ENCOUNTER — Telehealth: Payer: Self-pay

## 2015-09-07 LAB — COMPLETE METABOLIC PANEL WITH GFR
ALT: 12 U/L (ref 6–29)
AST: 14 U/L (ref 10–35)
Albumin: 4.5 g/dL (ref 3.6–5.1)
Alkaline Phosphatase: 42 U/L (ref 33–130)
BILIRUBIN TOTAL: 0.7 mg/dL (ref 0.2–1.2)
BUN: 14 mg/dL (ref 7–25)
CHLORIDE: 104 mmol/L (ref 98–110)
CO2: 25 mmol/L (ref 20–31)
Calcium: 9.4 mg/dL (ref 8.6–10.4)
Creat: 0.88 mg/dL (ref 0.50–0.99)
GFR, EST NON AFRICAN AMERICAN: 70 mL/min (ref >=60)
GFR, Est African American: 81 mL/min (ref >=60)
GLUCOSE: 102 mg/dL — AB (ref 65–99)
Potassium: 4.1 mmol/L (ref 3.5–5.3)
SODIUM: 139 mmol/L (ref 135–146)
TOTAL PROTEIN: 7 g/dL (ref 6.1–8.1)

## 2015-09-07 MED ORDER — NAPROXEN 500 MG PO TABS: 500.0000 mg | ORAL_TABLET | Freq: Two times a day (BID) | ORAL | 1 refills | Status: DC

## 2015-09-07 NOTE — Telephone Encounter (Signed)
[facility] to see if we can change naproxin 550 to naproxin 500 because it's cheaper. Please call pharmacy! The patient is there now.  [phone]

## 2015-09-07 NOTE — Telephone Encounter (Signed)
Sent in for updated dosing.

## 2015-09-08 LAB — URINE CULTURE: Organism ID, Bacteria: 10000

## 2015-09-09 ENCOUNTER — Encounter: Payer: Self-pay | Admitting: Urgent Care

## 2015-12-14 ENCOUNTER — Ambulatory Visit (INDEPENDENT_AMBULATORY_CARE_PROVIDER_SITE_OTHER): Payer: BLUE CROSS/BLUE SHIELD | Admitting: Osteopathic Medicine

## 2015-12-14 VITALS — BP 120/72 | HR 64 | Temp 98.4°F | Resp 18 | Ht 65.0 in | Wt 188.0 lb

## 2015-12-14 DIAGNOSIS — K1379 Other lesions of oral mucosa: Secondary | ICD-10-CM | POA: Insufficient documentation

## 2015-12-14 DIAGNOSIS — G8929 Other chronic pain: Secondary | ICD-10-CM | POA: Diagnosis not present

## 2015-12-14 DIAGNOSIS — M25561 Pain in right knee: Secondary | ICD-10-CM

## 2015-12-14 DIAGNOSIS — Z23 Encounter for immunization: Secondary | ICD-10-CM

## 2015-12-14 DIAGNOSIS — M25562 Pain in left knee: Secondary | ICD-10-CM | POA: Diagnosis not present

## 2015-12-14 MED ORDER — NAPROXEN SODIUM 550 MG PO TABS: 550.0000 mg | ORAL_TABLET | Freq: Two times a day (BID) | ORAL | 0 refills | Status: DC

## 2015-12-14 NOTE — Progress Notes (Signed)
HPI: Alexandria Walsh is a 65 y.o. female  who presents to [facility] today, 12/14/15,  for chief complaint of:  Chief Complaint  Patient presents with  . Cough    BLOOD COMING UP  . Knee Pain    Coughing/Spitting up blood: Woke up and felt like lump in her throat, she coughed up a small clot of blood. Recently treated by PCP for sinus infection w/ antibiotics. Dental cleaning yesterday, no dental pain.   Knee pain . Location: both knees . Quality: pain and swelling  . Context: no injury. . Modifying factors: Worse when she has slept on her front side, doesn't bother her much otherwise     Past medical, surgical, social and family history reviewed: Past Medical History:  Diagnosis Date  . Hypertension    Past Surgical History:  Procedure Laterality Date  . [place]  . MYOMECTOMY  1987   Social History  Substance Use Topics  . Smoking status: Current Some Day Smoker    Types: Cigarettes  . Smokeless tobacco: Never Used  . Alcohol use 0.0 oz/week     Comment: occas   Family History  Problem Relation Age of Onset  . Hypertension Mother   . Hypertension Father   . Heart disease Other   . Diabetes Maternal Aunt   . Heart disease Maternal Uncle      Current medication list and allergy/intolerance information reviewed:   Current Outpatient Prescriptions  Medication Sig Dispense Refill  . amLODipine (NORVASC) 5 MG tablet Take 1 tablet (5 mg total) by mouth daily. 90 tablet 3  . levothyroxine (SYNTHROID, LEVOTHROID) 50 MCG tablet Take 50 mcg by mouth daily.      . metFORMIN (GLUCOPHAGE) 500 MG tablet Take 1 tablet (500 mg total) by mouth daily with breakfast. 90 tablet 3  . montelukast (SINGULAIR) 10 MG tablet Take 10 mg by mouth at bedtime.    . naproxen sodium (ANAPROX DS) 550 MG tablet Take 1 tablet (550 mg total) by mouth 2 (two) times daily with a meal. 30 tablet 1   No current facility-administered medications for this  visit.    No Known Allergies    Review of Systems:  Constitutional:  No  fever, no chills, No recent illness, No unintentional weight changes. No significant fatigue.   HEENT: No  headache, no vision change  Cardiac: No  chest pain, No  pressure  Respiratory:  No  shortness of breath. No  Cough  Gastrointestinal: No  abdominal pain, No  nausea, No  vomiting  Musculoskeletal: No new myalgia/arthralgia, +chronic knee pain  Neurologic: No  weakness, No  dizziness,   Exam:  BP 120/72 (BP Location: Right Arm, Patient Position: Sitting, Cuff Size: Small)   Pulse 64   Temp 98.4 F (36.9 C) (Oral)   Resp 18   Ht 5\' 5"  (1.651 m)   Wt 188 lb (85.3 kg)   SpO2 98%   BMI 31.28 kg/m   Constitutional: VS see above. General Appearance: alert, well-developed, well-nourished, NAD  Eyes: Normal lids and conjunctive, non-icteric sclera  Ears, Nose, Mouth, Throat: MMM, Normal external inspection ears/nares/mouth/lips/gums. TM normal bilaterally. Pharynx/tonsils no erythema, no exudate. Nasal mucosa normal. Bleeding from gum at base of R bottom molar - no obious caries, cleared away clots, visible oozing blood which was stopped after patient sat 10 minutes biting rolled-up gauze  Neck: No masses, trachea midline  Respiratory: Normal respiratory effort. no wheeze, no rhonchi, no  rales  Cardiovascular: S1/S2 normal, no murmur, no rub/gallop auscultated. RRR. No lower extremity edema.   Musculoskeletal: Gait normal. No clubbing/cyanosis of digits.   Knee exam: bilateral Patellar Subluxation: negative. Minimal crepitus. Neg Mc[place] bilaterally. No effusion. .   Skin: warm, dry, intact. No rash/ulcer.    Psychiatric: Normal judgment/insight. Normal mood and affect. Oriented x3.      ASSESSMENT/PLAN:   Bleeding from gum post dental procedure, sent home w gauze to hold pressure if bleeding recurs, advised I think she is swallowing blood from mouth rather than other ore concerning  cause, needs f/u w/ dentist   Advised f/u with PCP re: chronic knee pain, possible arthritis, advised to avoid exacerbating motions/activities such as sleeping on her front side.   Bleeding in mouth  Chronic pain of both knees  Need for prophylactic vaccination and inoculation against influenza - Plan: Flu Vaccine QUAD 36+ mos IM  Leg pain, bilateral - Plan: naproxen sodium (ANAPROX DS) 550 MG tablet        Visit summary with medication list and pertinent instructions was printed for patient to review. All questions at time of visit were answered - patient instructed to contact office with any additional concerns. ER/RTC precautions were reviewed with the patient. Follow-up plan: Return if symptoms worsen or fail to improve, and as needed.

## 2015-12-14 NOTE — Patient Instructions (Addendum)
If bleeding comes back, use the gauze to bite down on the bleeding area for 5 - 10 minutes. If bleeding persists or worsens, please come see [place] but first I would try to call your dentist office and see if they have any other recommendations.   Knee pain: take Naproxen as needed and follow up with your PCP.     IF you received an x-ray today, you will receive an invoice from [facility]. Please contact [facility] at [phone] with questions or concerns regarding your invoice.   IF you received labwork today, you will receive an invoice from [employer]. Please contact [facility] at [phone] with questions or concerns regarding your invoice.   Our billing staff will not be able to assist you with questions regarding bills from these companies.  You will be contacted with the lab results as soon as they are available. The fastest way to get your results is to activate your My Chart account. Instructions are located on the last page of this paperwork. If you have not heard from [place] regarding the results in 2 weeks, please contact this office.

## 2016-07-14 ENCOUNTER — Ambulatory Visit (HOSPITAL_COMMUNITY)
Admission: EM | Admit: 2016-07-14 | Discharge: 2016-07-14 | Disposition: A | Discharge status: Home / Self Care | Payer: BLUE CROSS/BLUE SHIELD | Attending: Internal Medicine | Admitting: Internal Medicine

## 2016-07-14 ENCOUNTER — Ambulatory Visit (INDEPENDENT_AMBULATORY_CARE_PROVIDER_SITE_OTHER): Payer: BLUE CROSS/BLUE SHIELD

## 2016-07-14 ENCOUNTER — Encounter (HOSPITAL_COMMUNITY): Payer: Self-pay | Admitting: Emergency Medicine

## 2016-07-14 ENCOUNTER — Other Ambulatory Visit (HOSPITAL_COMMUNITY): Payer: BLUE CROSS/BLUE SHIELD

## 2016-07-14 DIAGNOSIS — W19XXXA Unspecified fall, initial encounter: Secondary | ICD-10-CM | POA: Diagnosis not present

## 2016-07-14 DIAGNOSIS — S300XXA Contusion of lower back and pelvis, initial encounter: Secondary | ICD-10-CM

## 2016-07-14 DIAGNOSIS — S32030A Wedge compression fracture of third lumbar vertebra, initial encounter for closed fracture: Secondary | ICD-10-CM

## 2016-07-14 DIAGNOSIS — S39012A Strain of muscle, fascia and tendon of lower back, initial encounter: Secondary | ICD-10-CM

## 2016-07-14 HISTORY — DX: Type 2 diabetes mellitus without complications: E11.9

## 2016-07-14 MED ORDER — IBUPROFEN 800 MG PO TABS
ORAL_TABLET | ORAL | Status: AC
  Filled 2016-07-14: qty 1

## 2016-07-14 MED ORDER — NAPROXEN 250 MG PO TABS: 250.0000 mg | ORAL_TABLET | Freq: Two times a day (BID) | ORAL | 0 refills | Status: DC

## 2016-07-14 MED ORDER — IBUPROFEN 800 MG PO TABS
400.0000 mg | ORAL_TABLET | Freq: Once | ORAL | Status: AC
  Administered 2016-07-14: 400 mg via ORAL

## 2016-07-14 MED ORDER — HYDROCODONE-ACETAMINOPHEN 5-325 MG PO TABS
ORAL_TABLET | ORAL | Status: AC
  Filled 2016-07-14: qty 1

## 2016-07-14 MED ORDER — HYDROCODONE-ACETAMINOPHEN 5-325 MG PO TABS: 1.0000 | ORAL_TABLET | ORAL | 0 refills | Status: DC | PRN

## 2016-07-14 MED ORDER — HYDROCODONE-ACETAMINOPHEN 5-325 MG PO TABS
1.0000 | ORAL_TABLET | Freq: Once | ORAL | Status: AC
  Administered 2016-07-14: 1 via ORAL

## 2016-07-14 NOTE — ED Provider Notes (Signed)
CSN: [id number]     Arrival date & time 07/14/16  1013 History   First MD Initiated Contact with Patient 07/14/16 1217     Chief Complaint  Patient presents with  . 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 She states she developed pain at the base of the mid spine and along the lumbosacral musculature. Denies striking her head or injuring her neck read denies injury to the upper extremities. She states she is sore in the left anterior thigh but no pain. She was able to get up with assistance and ambulate. Bending forward causes pain in the lower back. Denies focal paresthesias or weakness in the bilateral extremities.      Past Medical History:  Diagnosis Date  . Diabetes mellitus without complication ([place])   . Hypertension    Past Surgical History:  Procedure Laterality Date  . [place]  . MYOMECTOMY  1987   Family History  Problem Relation Age of Onset  . Hypertension Mother   . Hypertension Father   . Heart disease Other   . Diabetes Maternal Aunt   . Heart disease Maternal Uncle    Social History  Substance Use Topics  . Smoking status: Current Some Day Smoker    Types: Cigarettes  . Smokeless tobacco: Never Used  . Alcohol use 0.0 oz/week     Comment: occas   OB History    No data available     Review of Systems  Constitutional: Negative.  Negative for activity change, chills and fever.  HENT: Negative.   Respiratory: Negative.   Cardiovascular: Negative.   Musculoskeletal: Positive for back pain and myalgias.       As per HPI  Skin: Negative for color change, pallor and rash.  Neurological: Negative.   All other systems reviewed and are negative.   Allergies  Patient has no known allergies.  Home Medications   Prior to Admission medications   Medication Sig Start Date End Date  Taking? Authorizing Provider  amLODipine (NORVASC) 5 MG tablet Take 1 tablet (5 mg total) by mouth daily. 09/06/15   [name], PA-C  levothyroxine (SYNTHROID, LEVOTHROID) 50 MCG tablet Take 50 mcg by mouth daily.      [provider]  metFORMIN (GLUCOPHAGE) 500 MG tablet Take 1 tablet (500 mg total) by mouth daily with breakfast. 09/06/15   [name], PA-C  montelukast (SINGULAIR) 10 MG tablet Take 10 mg by mouth at bedtime.    [provider]  naproxen sodium (ANAPROX DS) 550 MG tablet Take 1 tablet (550 mg total) by mouth 2 (two) times daily with a meal. As needed for pain 12/14/15   Emeterio Reeve, DO   Meds Ordered and Administered this Visit   Medications  HYDROcodone-acetaminophen (NORCO/VICODIN) 5-325 MG per tablet 1 tablet (1 tablet Oral Given 07/14/16 1233)  ibuprofen (ADVIL,MOTRIN) tablet 400 mg (400 mg Oral Given 07/14/16 1233)    BP (!) 172/67 (BP Location: Right Arm)   Pulse 66   Temp 98.4 F (36.9 C) (Oral)   Resp 18   SpO2 100%  No data found.   Physical Exam  Constitutional: She is oriented to person, place, and time. She appears well-developed and well-nourished. No distress.  HENT:  Head: Normocephalic and atraumatic.  Eyes: EOM are normal.  Neck: Normal range of motion. Neck supple.  No cervical tenderness.  Cardiovascular: Normal rate.  Pulmonary/Chest: Effort normal.  Musculoskeletal: She exhibits tenderness. She exhibits no edema or deformity.  Tenderness to light palpation of the lower thoracic primarily the lumbosacral spine and paraspinal musculature. No deformities, discoloration or swelling noted along the spine. The patient is able to stand from a sitting position and ambulate a few steps to the door, turned around and then sit in the chair.  Lymphadenopathy:    She has no cervical adenopathy.  Neurological: She is alert and oriented to person, place, and time.  Skin: Skin is warm and dry.  Psychiatric: She has a normal mood and  affect.  Nursing note and vitals reviewed.   Urgent Care Course     Procedures (including critical care time)  Labs Review Labs Reviewed - No data to display  Imaging Review Dg Lumbar Spine Complete  Result Date: 07/14/2016 CLINICAL DATA:  Fall EXAM: LUMBAR SPINE - COMPLETE 4+ VIEW COMPARISON:  None. FINDINGS: Anatomic alignment. There is a compression fracture at L3 with 20% loss of height anteriorly. There is no obvious retropulsion. Vertebral body height is otherwise maintained. Mild narrowing of the L4-5 disc. IMPRESSION: L3 compression fracture as described. This may represent an acute compression fracture. MRI can be performed as clinically indicated Electronically Signed   By: Marybelle Killings M.D.   On: 07/14/2016 12:57     Visual Acuity Review  Right Eye Distance:   Left Eye Distance:   Bilateral Distance:    Right Eye Near:   Left Eye Near:    Bilateral Near:         MDM   1. Fall, initial encounter   2. Contusion, buttock, initial encounter   3. Lumbar strain, initial encounter   4. Compression fracture of L3 lumbar vertebra, closed, initial encounter ([place])     Ice to the middle areas of pain in the low back for the next 2-3 days. After that he may apply heat primarily to the muscles on the sides of your back. If needed he may sit on a donut pillow if that is more comfortable. Do not stay in bed as this increases your chance for blood clots. Take the pain medicines as directed. The medication for pain with hydrocodone can cause sleepiness and can promote falls so be very careful when walking and moving around. Follow-up with your primary care doctor this week. Call for an appointment today.   Janne Napoleon, NP 07/14/16 1331

## 2016-07-14 NOTE — Discharge Instructions (Signed)
Ice to the middle areas of pain in the low back for the next 2-3 days. After that he may apply heat primarily to the muscles on the sides of your back. If needed he may sit on a donut pillow if that is more comfortable. Do not stay in bed as this increases your chance for blood clots. Take the pain medicines as directed. The medication for pain with hydrocodone can cause sleepiness and can promote falls so be very careful when walking and moving around. Follow-up with your primary care doctor this week. Call for an appointment today.

## 2016-07-14 NOTE — ED Triage Notes (Signed)
The patient presented to the [facility] with a complaint of bilateral leg and back pain secondary to a fall at work today. 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

## 2017-10-29 ENCOUNTER — Encounter (HOSPITAL_COMMUNITY): Payer: Self-pay | Admitting: Emergency Medicine

## 2017-10-29 ENCOUNTER — Ambulatory Visit (HOSPITAL_COMMUNITY)
Admission: EM | Admit: 2017-10-29 | Discharge: 2017-10-29 | Disposition: A | Discharge status: Home / Self Care | Payer: Medicare Other

## 2017-10-29 DIAGNOSIS — J40 Bronchitis, not specified as acute or chronic: Secondary | ICD-10-CM | POA: Diagnosis not present

## 2017-10-29 MED ORDER — BENZONATATE 100 MG PO CAPS
100.0000 mg | ORAL_CAPSULE | Freq: Three times a day (TID) | ORAL | 0 refills | Status: DC
Start: 2017-10-29 — End: 2018-01-18

## 2017-10-29 MED ORDER — ALBUTEROL SULFATE HFA 108 (90 BASE) MCG/ACT IN AERS: 1.0000 | INHALATION_SPRAY | Freq: Four times a day (QID) | RESPIRATORY_TRACT | 0 refills | Status: DC | PRN

## 2017-10-29 MED ORDER — PREDNISONE 10 MG (21) PO TBPK: ORAL_TABLET | ORAL | 0 refills | Status: DC

## 2017-10-29 MED ORDER — GUAIFENESIN 100 MG/5ML PO LIQD
100.0000 mg | ORAL | 0 refills | Status: DC | PRN
Start: 2017-10-29 — End: 2018-01-18

## 2017-10-29 NOTE — ED Triage Notes (Signed)
Pt c/o cough, wheezing, congestion x2 weeks.

## 2017-10-29 NOTE — Discharge Instructions (Addendum)
It was nice meeting you!!  We are treating you for bronchitis.  Prednisone taper. Continue the Flonase and Singulair Albuterol inhaler as needed for cough, wheezing and SOB.  Tessalon pearls for cough.  Guaifenesin for congestion.  Follow up as needed for continued or worsening symptoms

## 2017-10-29 NOTE — ED Provider Notes (Signed)
[place]    CSN: [id number] Arrival date & time: 10/29/17  1100     History   Chief Complaint Chief Complaint  Patient presents with  . URI    HPI Francisca Langenderfer is a 67 y.o. female.   Patient is a 67 year old female past medical history of diabetes and hypertension.  She presents for 2 weeks of cough, congestion, sore throat, wheezing.  Her symptoms have worsened over the last week.  She has been using DayQuil, Coricidin, Alka-Seltzer with some relief of symptoms.  Reports when she lays down she hears wheezing and has a lot of mucus.  She has a hacking cough and some chest tenderness due to all the coughing.  She denies any associated fever, chills, body aches, fatigue.  She is a current everyday smoker.  She has a past medical history of allergies and takes daily allergy medicine.  This denies any recent sick contacts.  Denies any history of asthma, COPD.   URI  Presenting symptoms: congestion, cough, ear pain, rhinorrhea and sore throat   Presenting symptoms: no facial pain, no fatigue and no fever   Severity:  Moderate Onset quality:  Gradual Duration:  2 weeks Timing:  Constant Progression:  Worsening Chronicity:  New Relieved by: alkaseltzer    Past Medical History:  Diagnosis Date  . Diabetes mellitus without complication ([place])   . Hypertension     Patient Active Problem List   Diagnosis Date Noted  . Bleeding in mouth 12/14/2015  . Chronic pain of both knees 12/14/2015  . HTN (hypertension) 10/24/2013  . Unspecified hypothyroidism 10/24/2013  . Esophageal reflux 10/24/2013    Past Surgical History:  Procedure Laterality Date  . [place]  . MYOMECTOMY  1987    OB History   None      Home Medications    Prior to Admission medications   Medication Sig Start Date End Date Taking? Authorizing Provider  cyclobenzaprine (FLEXERIL) 10 MG tablet Take 10 mg by mouth 3 (three) times daily as needed for muscle spasms.   Yes  [provider]  loratadine (CLARITIN) 10 MG tablet Take 10 mg by mouth daily.   Yes [provider]  meloxicam (MOBIC) 15 MG tablet Take 15 mg by mouth daily.   Yes [provider]  albuterol (PROVENTIL HFA;VENTOLIN HFA) 108 (90 Base) MCG/ACT inhaler Inhale 1-2 puffs into the lungs every 6 (six) hours as needed for wheezing or shortness of breath. 10/29/17   Leonor Darnell, Tressia Miners A, NP  amLODipine (NORVASC) 5 MG tablet Take 1 tablet (5 mg total) by mouth daily. 09/06/15   [name], PA-C  benzonatate (TESSALON) 100 MG capsule Take 1 capsule (100 mg total) by mouth every 8 (eight) hours. 10/29/17   Loura Halt A, NP  guaiFENesin (ROBITUSSIN) 100 MG/5ML liquid Take 5-10 mLs (100-200 mg total) by mouth every 4 (four) hours as needed for cough. 10/29/17   Loura Halt A, NP  HYDROcodone-acetaminophen (NORCO/VICODIN) 5-325 MG tablet Take 1 tablet by mouth every 4 (four) hours as needed. 07/14/16   Janne Napoleon, NP  levothyroxine (SYNTHROID, LEVOTHROID) 50 MCG tablet Take 50 mcg by mouth daily.      [provider]  metFORMIN (GLUCOPHAGE) 500 MG tablet Take 1 tablet (500 mg total) by mouth daily with breakfast. 09/06/15   [name], PA-C  montelukast (SINGULAIR) 10 MG tablet Take 10 mg by mouth at bedtime.    [provider]  naproxen (NAPROSYN) 250 MG tablet  Take 1 tablet (250 mg total) by mouth 2 (two) times daily with a meal. 07/14/16   Janne Napoleon, NP  naproxen sodium (ANAPROX DS) 550 MG tablet Take 1 tablet (550 mg total) by mouth 2 (two) times daily with a meal. As needed for pain Patient not taking: Reported on 10/29/2017 12/14/15   Emeterio Reeve, DO  predniSONE (STERAPRED UNI-PAK 21 TAB) 10 MG (21) TBPK tablet 6 tabs for 1 day, then 5 tabs for 1 das, then 4 tabs for 1 day, then 3 tabs for 1 day, 2 tabs for 1 day, then 1 tab for 1 day 10/29/17   Orvan July, NP    Family History Family History  Problem Relation Age of Onset  . Hypertension Mother   .  Hypertension Father   . Heart disease Other   . Diabetes Maternal Aunt   . Heart disease Maternal Uncle     Social History Social History   Tobacco Use  . Smoking status: Current Some Day Smoker    Types: Cigarettes  . Smokeless tobacco: Never Used  Substance Use Topics  . Alcohol use: Yes    Alcohol/week: 0.0 standard drinks    Comment: occas  . Drug use: No     Allergies   Patient has no known allergies.   Review of Systems Review of Systems  Constitutional: Negative for fatigue and fever.  HENT: Positive for congestion, ear pain, rhinorrhea and sore throat.   Respiratory: Positive for cough.      Physical Exam Triage Vital Signs ED Triage Vitals  Enc Vitals Group     BP 10/29/17 1133 127/66     Pulse Rate 10/29/17 1132 73     Resp 10/29/17 1132 16     Temp 10/29/17 1132 98.1 F (36.7 C)     Temp src --      SpO2 10/29/17 1132 100 %     Weight --      Height --      Head Circumference --      Peak Flow --      Pain Score --      Pain Loc --      Pain Edu? --      Excl. in [place]? --    No data found.  Updated Vital Signs BP 127/66   Pulse 73   Temp 98.1 F (36.7 C)   Resp 16   SpO2 100%   Visual Acuity Right Eye Distance:   Left Eye Distance:   Bilateral Distance:    Right Eye Near:   Left Eye Near:    Bilateral Near:     Physical Exam  Constitutional: She appears well-developed and well-nourished.  Very pleasant. Non toxic or ill appearing.     HENT:  Head: Normocephalic and atraumatic.  Bilateral TMs normal.  External ears normal.  Without posterior oropharyngeal erythema, tonsillar swelling or exudates. No lesions. Significant drainage in posterior oropharynx Nasal turbinate swelling.      Eyes: Conjunctivae are normal.  Neck: Normal range of motion.  Cardiovascular: Normal rate, regular rhythm and normal heart sounds.  Pulmonary/Chest: Effort normal.  Coarse lung sounds. No dyspnea or distress. No retractions or nasal  flaring. No rhonchi or wheezing.     Musculoskeletal: Normal range of motion.  Neurological: She is alert.  Skin: Skin is warm. No rash noted. No erythema. No pallor.  Psychiatric: She has a normal mood and affect.  Nursing note and vitals reviewed.    UC  Treatments / Results  Labs (all labs ordered are listed, but only abnormal results are displayed) Labs Reviewed - No data to display  EKG None  Radiology No results found.  Procedures Procedures (including critical care time)  Medications Ordered in UC Medications - No data to display  Initial Impression / Assessment and Plan / UC Course  I have reviewed the triage vital signs and the nursing notes.  Pertinent labs & imaging results that were available during my care of the patient were reviewed by me and considered in my medical decision making (see chart for details).    No concern for pneumonia Bronchitis- will treat with steroid taper and albuterol inhaler.  Tessalon for cough and guaifenesin for congestion.  Instructed to follow-up for continued or worsening symptoms. Final Clinical Impressions(s) / UC Diagnoses   Final diagnoses:  Bronchitis     Discharge Instructions     It was nice meeting you!!  We are treating you for bronchitis.  Prednisone taper. Continue the Flonase and Singulair Albuterol inhaler as needed for cough, wheezing and SOB.  Tessalon pearls for cough.  Guaifenesin for congestion.  Follow up as needed for continued or worsening symptoms     ED Prescriptions    Medication Sig Dispense Auth. Provider   benzonatate (TESSALON) 100 MG capsule Take 1 capsule (100 mg total) by mouth every 8 (eight) hours. 21 capsule Wanisha Shiroma A, NP   albuterol (PROVENTIL HFA;VENTOLIN HFA) 108 (90 Base) MCG/ACT inhaler Inhale 1-2 puffs into the lungs every 6 (six) hours as needed for wheezing or shortness of breath. 1 Inhaler Dean Goldner A, NP   predniSONE (STERAPRED UNI-PAK 21 TAB) 10 MG (21) TBPK  tablet 6 tabs for 1 day, then 5 tabs for 1 das, then 4 tabs for 1 day, then 3 tabs for 1 day, 2 tabs for 1 day, then 1 tab for 1 day 21 tablet Keiasia Christianson A, NP   guaiFENesin (ROBITUSSIN) 100 MG/5ML liquid Take 5-10 mLs (100-200 mg total) by mouth every 4 (four) hours as needed for cough. 60 mL Loura Halt A, NP     Controlled Substance Prescriptions [place] Controlled Substance Registry consulted? Not Applicable   Orvan July, NP 10/29/17 1209

## 2017-12-30 ENCOUNTER — Encounter: Payer: Self-pay | Admitting: Gastroenterology

## 2018-01-10 ENCOUNTER — Encounter: Payer: Self-pay | Admitting: Gastroenterology

## 2018-01-18 ENCOUNTER — Encounter: Payer: Self-pay | Admitting: Gastroenterology

## 2018-01-18 ENCOUNTER — Ambulatory Visit (AMBULATORY_SURGERY_CENTER): Payer: Self-pay | Admitting: *Deleted

## 2018-01-18 VITALS — Ht 63.5 in | Wt 204.0 lb

## 2018-01-18 DIAGNOSIS — Z1211 Encounter for screening for malignant neoplasm of colon: Secondary | ICD-10-CM

## 2018-01-18 MED ORDER — PEG 3350-KCL-NA BICARB-NACL 420 G PO SOLR: 4000.0000 mL | Freq: Once | ORAL | 0 refills | Status: AC

## 2018-01-18 NOTE — Progress Notes (Signed)
Patient denies any allergies to eggs or soy. Patient denies any problems with anesthesia/sedation. Patient denies any oxygen use at home. Patient denies taking any diet/weight loss medications or blood thinners. EMMI education assisgned to patient on colonoscopy, this was explained and instructions given to patient. 

## 2018-01-20 ENCOUNTER — Encounter: Payer: Medicare Other | Admitting: Gastroenterology

## 2018-02-01 ENCOUNTER — Ambulatory Visit (AMBULATORY_SURGERY_CENTER): Payer: Medicare Other | Admitting: Gastroenterology

## 2018-02-01 ENCOUNTER — Encounter: Payer: Self-pay | Admitting: Gastroenterology

## 2018-02-01 VITALS — BP 148/104 | HR 86 | Temp 98.0°F | Resp 18 | Ht 63.5 in | Wt 204.0 lb

## 2018-02-01 DIAGNOSIS — Z1211 Encounter for screening for malignant neoplasm of colon: Secondary | ICD-10-CM | POA: Diagnosis present

## 2018-02-01 DIAGNOSIS — D123 Benign neoplasm of transverse colon: Secondary | ICD-10-CM

## 2018-02-01 DIAGNOSIS — K635 Polyp of colon: Secondary | ICD-10-CM

## 2018-02-01 MED ORDER — SODIUM CHLORIDE 0.9 % IV SOLN: 500.0000 mL | Freq: Once | INTRAVENOUS | Status: DC

## 2018-02-01 NOTE — Progress Notes (Signed)
Called to room to assist during endoscopic procedure.  Patient ID and intended procedure confirmed with present staff. Received instructions for my participation in the procedure from the performing physician.  

## 2018-02-01 NOTE — Patient Instructions (Signed)
Handouts Provided:  Polyps and High Fiber Diet  YOU HAD AN ENDOSCOPIC PROCEDURE TODAY AT [facility]:   Refer to the procedure report that was given to you for any specific questions about what was found during the examination.  If the procedure report does not answer your questions, please call your gastroenterologist to clarify.  If you requested that your care partner not be given the details of your procedure findings, then the procedure report has been included in a sealed envelope for you to review at your convenience later.  YOU SHOULD EXPECT: Some feelings of bloating in the abdomen. Passage of more gas than usual.  Walking can help get rid of the air that was put into your GI tract during the procedure and reduce the bloating. If you had a lower endoscopy (such as a colonoscopy or flexible sigmoidoscopy) you may notice spotting of blood in your stool or on the toilet paper. If you underwent a bowel prep for your procedure, you may not have a normal bowel movement for a few days.  Please Note:  You might notice some irritation and congestion in your nose or some drainage.  This is from the oxygen used during your procedure.  There is no need for concern and it should clear up in a day or so.  SYMPTOMS TO REPORT IMMEDIATELY:   Following lower endoscopy (colonoscopy or flexible sigmoidoscopy):  Excessive amounts of blood in the stool  Significant tenderness or worsening of abdominal pains  Swelling of the abdomen that is new, acute  Fever of 100F or higher   For urgent or emergent issues, a gastroenterologist can be reached at any hour by calling [phone].   DIET:  We do recommend a small meal at first, but then you may proceed to your regular diet.  Drink plenty of fluids but you should avoid alcoholic beverages for 24 hours.  ACTIVITY:  You should plan to take it easy for the rest of today and you should NOT DRIVE or use heavy machinery until tomorrow (because  of the sedation medicines used during the test).    FOLLOW UP: Our staff will call the number listed on your records the next business day following your procedure to check on you and address any questions or concerns that you may have regarding the information given to you following your procedure. If we do not reach you, we will leave a message.  However, if you are feeling well and you are not experiencing any problems, there is no need to return our call.  We will assume that you have returned to your regular daily activities without incident.  If any biopsies were taken you will be contacted by phone or by letter within the next 1-3 weeks.  Please call us at [phone] if you have not heard about the biopsies in 3 weeks.    SIGNATURES/CONFIDENTIALITY: You and/or your care partner have signed paperwork which will be entered into your electronic medical record.  These signatures attest to the fact that that the information above on your After Visit Summary has been reviewed and is understood.  Full responsibility of the confidentiality of this discharge information lies with you and/or your care-partner.

## 2018-02-01 NOTE — Progress Notes (Signed)
Report given to PACU, vss 

## 2018-02-01 NOTE — Op Note (Signed)
[facility] Patient Name: Alexandria Walsh Procedure Date: 02/01/2018 9:42 AM MRN: [id number] Endoscopist: Justice Britain , MD Age: 67 Referring MD:  Date of Birth: 07-Mar-1950 Gender: Female Account #: [id number] Procedure:                Colonoscopy Indications:              Screening for colorectal malignant neoplasm Medicines:                Monitored Anesthesia Care Procedure:                Pre-Anesthesia Assessment:                           - Prior to the procedure, a History and Physical                            was performed, and patient medications and                            allergies were reviewed. The patient's tolerance of                            previous anesthesia was also reviewed. The risks                            and benefits of the procedure and the sedation                            options and risks were discussed with the patient.                            All questions were answered, and informed consent                            was obtained. Prior Anticoagulants: The patient has                            taken no previous anticoagulant or antiplatelet                            agents. ASA Grade Assessment: III - A patient with                            severe systemic disease. After reviewing the risks                            and benefits, the patient was deemed in                            satisfactory condition to undergo the procedure.                           After obtaining informed consent, the colonoscope  was passed under direct vision. Throughout the                            procedure, the patient's blood pressure, pulse, and                            oxygen saturations were monitored continuously. The                            Colonoscope was introduced through the anus and                            advanced to the the cecum, identified by                            appendiceal orifice  and ileocecal valve. The                            colonoscopy was performed without difficulty. The                            patient tolerated the procedure. The quality of the                            bowel preparation was evaluated using the BBPS                            [facility] Bowel Preparation Scale) with scores of:                            Right Colon = 2 (minor amount of residual staining,                            small fragments of stool and/or opaque liquid, but                            mucosa seen well), Transverse Colon = 2 (minor                            amount of residual staining, small fragments of                            stool and/or opaque liquid, but mucosa seen well)                            and Left Colon = 3 (entire mucosa seen well with no                            residual staining, small fragments of stool or                            opaque liquid). The total BBPS score equals 7. The  quality of the bowel preparation was fair. Scope In: 9:54:44 AM Scope Out: 10:21:03 AM Scope Withdrawal Time: 0 hours 21 minutes 38 seconds  Total Procedure Duration: 0 hours 26 minutes 19 seconds  Findings:                 Skin tags were found on perianal exam.                           The digital rectal exam findings include                            non-thrombosed external hemorrhoids and                            non-thrombosed internal hemorrhoids. Pertinent                            negatives include no palpable rectal lesions.                           A moderate amount of stool was found in the entire                            colon, interfering with visualization. Lavage of                            the area was performed using copious amounts,                            resulting in clearance with fair visualization.                           Three sessile polyps were found in the descending                             colon (1), splenic flexure (1) and ascending colon                            (1). The polyps were 3 to 6 mm in size. These                            polyps were removed with a cold snare. Resection                            and retrieval were complete.                           A few small-mouthed diverticula were found in the                            sigmoid colon.                           Normal mucosa was found in the entire colon  otherwise.                           Non-bleeding non-thrombosed external and internal                            hemorrhoids were found during retroflexion, during                            perianal exam and during digital exam. The                            hemorrhoids were Grade II (internal hemorrhoids                            that prolapse but reduce spontaneously). Complications:            No immediate complications. Estimated Blood Loss:     Estimated blood loss was minimal. Impression:               - Preparation of the colon was fair.                           - Perianal skin tags found on perianal exam.                           - Non-thrombosed external hemorrhoids and                            non-thrombosed internal hemorrhoids found on                            digital rectal exam.                           - Stool in the entire examined colon.                           - Three 3 to 6 mm polyps in the descending colon,                            at the splenic flexure and in the ascending colon,                            removed with a cold snare. Resected and retrieved.                           - Diverticulosis in the sigmoid colon.                           - Normal mucosa in the entire examined colon                            otherwise.                           -  Non-bleeding non-thrombosed external and internal                            hemorrhoids. Recommendation:           - The patient will be  observed post-procedure,                            until all discharge criteria are met.                           - Discharge patient to home.                           - Patient has a contact number available for                            emergencies. The signs and symptoms of potential                            delayed complications were discussed with the                            patient. Return to normal activities tomorrow.                            Written discharge instructions were provided to the                            patient.                           - High fiber diet.                           - Use fiber, for example Citrucel, Fibercon, Konsyl                            or Metamucil.                           - Await pathology results.                           - Repeat colonoscopy in 3 - 5 years for                            surveillance based on pathology results and                            findings of adenomatous tissue. If all tissue is                            adenomatous then 3-years. If only 1-2 polyps are  adenomatous in 5-years. If all tissue is not                            adenomatous, due to the patient's preparation I                            recommend a 5-year follow up colonoscopy for                            screening purposes.                           - The findings and recommendations were discussed                            with the patient.                           - The findings and recommendations were discussed                            with the patient's family. Justice Britain, MD 02/01/2018 10:30:34 AM

## 2018-02-03 ENCOUNTER — Telehealth: Payer: Self-pay | Admitting: *Deleted

## 2018-02-03 NOTE — Telephone Encounter (Signed)
  Follow up Call-  Call back number 02/01/2018  Post procedure Call Back phone  # [phone]  Permission to leave phone message Yes  Some recent data might be hidden    LM to call if has any concerns or questions

## 2018-02-12 ENCOUNTER — Encounter: Payer: Self-pay | Admitting: Gastroenterology

## 2018-06-15 ENCOUNTER — Emergency Department (HOSPITAL_COMMUNITY)
Admission: EM | Admit: 2018-06-15 | Discharge: 2018-06-15 | Disposition: A | Discharge status: Home / Self Care | Payer: Medicare Other | Attending: Emergency Medicine | Admitting: Emergency Medicine

## 2018-06-15 ENCOUNTER — Other Ambulatory Visit: Payer: Self-pay

## 2018-06-15 ENCOUNTER — Encounter (HOSPITAL_COMMUNITY): Payer: Self-pay | Admitting: *Deleted

## 2018-06-15 ENCOUNTER — Emergency Department (HOSPITAL_COMMUNITY): Payer: Medicare Other

## 2018-06-15 DIAGNOSIS — Z7984 Long term (current) use of oral hypoglycemic drugs: Secondary | ICD-10-CM | POA: Diagnosis not present

## 2018-06-15 DIAGNOSIS — R609 Edema, unspecified: Secondary | ICD-10-CM | POA: Diagnosis not present

## 2018-06-15 DIAGNOSIS — Z79899 Other long term (current) drug therapy: Secondary | ICD-10-CM | POA: Diagnosis not present

## 2018-06-15 DIAGNOSIS — E119 Type 2 diabetes mellitus without complications: Secondary | ICD-10-CM | POA: Diagnosis not present

## 2018-06-15 DIAGNOSIS — I10 Essential (primary) hypertension: Secondary | ICD-10-CM | POA: Diagnosis not present

## 2018-06-15 DIAGNOSIS — Z87891 Personal history of nicotine dependence: Secondary | ICD-10-CM | POA: Insufficient documentation

## 2018-06-15 DIAGNOSIS — R0602 Shortness of breath: Secondary | ICD-10-CM | POA: Diagnosis not present

## 2018-06-15 DIAGNOSIS — R0789 Other chest pain: Secondary | ICD-10-CM | POA: Insufficient documentation

## 2018-06-15 DIAGNOSIS — E039 Hypothyroidism, unspecified: Secondary | ICD-10-CM | POA: Insufficient documentation

## 2018-06-15 LAB — CBC WITH DIFFERENTIAL/PLATELET
Abs Immature Granulocytes: 0.01 10*3/uL (ref 0.00–0.07)
Basophils Absolute: 0 10*3/uL (ref 0.0–0.1)
Basophils Relative: 0 %
Eosinophils Absolute: 0.1 10*3/uL (ref 0.0–0.5)
Eosinophils Relative: 2 %
HCT: 40.7 % (ref 36.0–46.0)
Hemoglobin: 13.2 g/dL (ref 12.0–15.0)
Immature Granulocytes: 0 %
Lymphocytes Relative: 34 %
Lymphs Abs: 2 10*3/uL (ref 0.7–4.0)
MCH: 29.5 pg (ref 26.0–34.0)
MCHC: 32.4 g/dL (ref 30.0–36.0)
MCV: 90.8 fL (ref 80.0–100.0)
Monocytes Absolute: 0.4 10*3/uL (ref 0.1–1.0)
Monocytes Relative: 7 %
Neutro Abs: 3.3 10*3/uL (ref 1.7–7.7)
Neutrophils Relative %: 57 %
Platelets: 191 10*3/uL (ref 150–400)
RBC: 4.48 MIL/uL (ref 3.87–5.11)
RDW: 13 % (ref 11.5–15.5)
WBC: 5.8 10*3/uL (ref 4.0–10.5)
nRBC: 0 % (ref 0.0–0.2)

## 2018-06-15 LAB — URINALYSIS, ROUTINE W REFLEX MICROSCOPIC
Bilirubin Urine: NEGATIVE
Glucose, UA: NEGATIVE mg/dL
Hgb urine dipstick: NEGATIVE
Ketones, ur: NEGATIVE mg/dL
Leukocytes,Ua: NEGATIVE
Nitrite: NEGATIVE
Protein, ur: NEGATIVE mg/dL
Specific Gravity, Urine: 1.006 (ref 1.005–1.030)
pH: 6 (ref 5.0–8.0)

## 2018-06-15 LAB — COMPREHENSIVE METABOLIC PANEL
ALT: 15 U/L (ref 0–44)
AST: 17 U/L (ref 15–41)
Albumin: 4.1 g/dL (ref 3.5–5.0)
Alkaline Phosphatase: 38 U/L (ref 38–126)
Anion gap: 15 (ref 5–15)
BUN: 8 mg/dL (ref 8–23)
CO2: 23 mmol/L (ref 22–32)
Calcium: 9.4 mg/dL (ref 8.9–10.3)
Chloride: 100 mmol/L (ref 98–111)
Creatinine, Ser: 0.78 mg/dL (ref 0.44–1.00)
GFR calc Af Amer: >60 mL/min (ref >60)
GFR calc non Af Amer: >60 mL/min (ref >60)
Glucose, Bld: 105 mg/dL — ABNORMAL HIGH (ref 70–99)
Potassium: 4 mmol/L (ref 3.5–5.1)
Sodium: 138 mmol/L (ref 135–145)
Total Bilirubin: 0.7 mg/dL (ref 0.3–1.2)
Total Protein: 7.1 g/dL (ref 6.5–8.1)

## 2018-06-15 LAB — BRAIN NATRIURETIC PEPTIDE: B Natriuretic Peptide: 17.3 pg/mL (ref 0.0–100.0)

## 2018-06-15 LAB — TROPONIN I: Troponin I: <0.03 ng/mL (ref <0.03)

## 2018-06-15 MED ORDER — FUROSEMIDE 10 MG/ML IJ SOLN
20.0000 mg | Freq: Once | INTRAMUSCULAR | Status: AC
  Administered 2018-06-15: 20 mg via INTRAVENOUS
  Filled 2018-06-15: qty 2

## 2018-06-15 MED ORDER — FUROSEMIDE 20 MG PO TABS: 20.0000 mg | ORAL_TABLET | Freq: Every day | ORAL | 0 refills | Status: DC

## 2018-06-15 NOTE — ED Triage Notes (Addendum)
Pt from home c/o bil LE swelling, L greater than R , great than R. Pt's md took her off of amlodipine and started her on lisinopril and hctz and the pt states the swelling has increased instead of improved.  Pt states L chest tightness and some wheezing.  States tightness increases to L chest when she lays flat and she has been sleeping in a recliner.  Pt also states recent weight gain.

## 2018-06-15 NOTE — ED Provider Notes (Signed)
[facility] EMERGENCY DEPARTMENT Provider Note   CSN: [id number] Arrival date & time: 06/15/18  1017    History   Chief Complaint Chief Complaint  Patient presents with  . Leg Swelling  . Chest Pain    HPI Alexandria Walsh is a 68 y.o. female presenting for evaluation of chest tightness, shortness of breath, leg swelling.  Patient states she has had bilateral leg swelling for the past several months, left worse than right.  When this began, her PCP changed her blood pressure medications, discontinuing amlodipine and starting her on lisinopril and HCTZ.  Patient states symptoms have continued to worsen.  2 days ago, she started to develop what she calls chest tightness, describing as a fullness, especially in the left side of her chest.  Last night she had difficulty laying flat due to shortness of breath, had to sleep in a recliner.  This is never happened before.  She denies shortness of breath at rest or with ambulation.  She denies recent fevers, chills, sore throat, cough, current chest pain, nausea, vomiting, abdominal pain, urinary symptoms, abnormal bowel movements.  She denies a history of heart failure, or needing to see a cardiologist in the past.  She has never had an echo.  Patient states she has noticed swelling coming up in her abdomen and around her face.  She reports a 20 pound weight gain in the past 2 months.  She called her PCP today, who recommended she come to the ED for further evaluation. Patient stop smoking 5 months ago, denies alcohol or drug use.  Additional history obtained from chart review.  Patient with a history of diabetes, hypertension, hypothyroidism.     HPI  Past Medical History:  Diagnosis Date  . Allergy   . Anemia   . Diabetes mellitus without complication ([place])   . Hypertension   . Thyroid disease     Patient Active Problem List   Diagnosis Date Noted  . Bleeding in mouth 12/14/2015  . Chronic pain of both knees 12/14/2015   . HTN (hypertension) 10/24/2013  . Unspecified hypothyroidism 10/24/2013  . Esophageal reflux 10/24/2013    Past Surgical History:  Procedure Laterality Date  . [place]  . COLONOSCOPY  at age 64   in [place] exam per pt.   [facility] MYOMECTOMY  1987     OB History   No obstetric history on file.      Home Medications    Prior to Admission medications   Medication Sig Start Date End Date Taking? Authorizing Provider  albuterol (PROVENTIL HFA;VENTOLIN HFA) 108 (90 Base) MCG/ACT inhaler Inhale 1-2 puffs into the lungs every 6 (six) hours as needed for wheezing or shortness of breath. 10/29/17   Loura Halt A, NP  amLODipine (NORVASC) 10 MG tablet  12/21/17   [provider]  fluticasone (FLONASE) 50 MCG/ACT nasal spray fluticasone propionate 50 mcg/actuation nasal spray,suspension  PLACE 2 SPRAYS INTO BOTH NOSTRILS DAILY.    [provider]  ipratropium (ATROVENT) 0.03 % nasal spray ipratropium bromide 0.03 % nasal spray  PLACE 2 SPRAYS INTO THE NOSE 2 (TWO) TIMES DAILY. 03/18/17   [provider]  levothyroxine (SYNTHROID, LEVOTHROID) 25 MCG tablet  10/22/17   [provider]  loratadine (CLARITIN) 10 MG tablet Take 10 mg by mouth daily.    [provider]  meloxicam (MOBIC) 15 MG tablet Take 15 mg by mouth daily.    [provider]  metFORMIN (GLUCOPHAGE)  500 MG tablet Take 1 tablet (500 mg total) by mouth daily with breakfast. 09/06/15   [name], PA-C  montelukast (SINGULAIR) 10 MG tablet Take 10 mg by mouth at bedtime.    [provider]  naproxen sodium (ANAPROX DS) 550 MG tablet Take 1 tablet (550 mg total) by mouth 2 (two) times daily with a meal. As needed for pain 12/14/15   Emeterio Reeve, DO  potassium chloride (K-DUR) 10 MEQ tablet Take 10 mEq by mouth daily.    [provider]    Family History Family History  Problem Relation Age of Onset  . Hypertension Mother    . Hypertension Father   . Heart disease Other   . Diabetes Maternal Aunt   . Heart disease Maternal Uncle   . Colon cancer Neg Hx   . Colon polyps Neg Hx   . Esophageal cancer Neg Hx   . Rectal cancer Neg Hx   . Stomach cancer Neg Hx     Social History Social History   Tobacco Use  . Smoking status: Former Smoker    Types: Cigarettes    Last attempt to quit: 09/18/2017    Years since quitting: 0.7  . Smokeless tobacco: Never Used  Substance Use Topics  . Alcohol use: Yes    Alcohol/week: 0.0 standard drinks    Comment: occas  . Drug use: No     Allergies   Patient has no known allergies.   Review of Systems Review of Systems  Respiratory: Positive for chest tightness and shortness of breath.   Cardiovascular: Positive for leg swelling.  All other systems reviewed and are negative.    Physical Exam Updated Vital Signs Ht 5' 3.5" (1.613 m)   Wt 92.1 kg   BMI 35.40 kg/m   Physical Exam Vitals signs and nursing note reviewed.  Constitutional:      General: She is not in acute distress.    Appearance: She is well-developed.     Comments: Appears nontoxic  HENT:     Head: Normocephalic and atraumatic.  Eyes:     Extraocular Movements: Extraocular movements intact.     Conjunctiva/sclera: Conjunctivae normal.     Pupils: Pupils are equal, round, and reactive to light.     Comments: Periorbital edema  Neck:     Musculoskeletal: Normal range of motion and neck supple.  Cardiovascular:     Rate and Rhythm: Normal rate and regular rhythm.     Pulses: Normal pulses.  Pulmonary:     Effort: Pulmonary effort is normal. No respiratory distress.     Breath sounds: Normal breath sounds. No wheezing.     Comments: Speaking in full sentences.  Clear lung sounds in all fields. Abdominal:     General: There is no distension.     Palpations: Abdomen is soft. There is no mass.     Tenderness: There is no abdominal tenderness. There is no guarding or rebound.      Comments: Patient is obese, but no obvious abdominal distention.  No tenderness.    Musculoskeletal: Normal range of motion.        General: Swelling present.     Comments: Bilateral 1+ pitting edema, worse on the left side.  Pedal pulses intact.  No calf tenderness.  Skin:    General: Skin is warm and dry.     Capillary Refill: Capillary refill takes less than 2 seconds.  Neurological:     Mental Status: She is alert and oriented  to person, place, and time.      ED Treatments / Results  Labs (all labs ordered are listed, but only abnormal results are displayed) Labs Reviewed  CBC WITH DIFFERENTIAL/PLATELET  COMPREHENSIVE METABOLIC PANEL  TROPONIN I  URINALYSIS, ROUTINE W REFLEX MICROSCOPIC  BRAIN NATRIURETIC PEPTIDE    EKG None  Radiology No results found.  Procedures Procedures (including critical care time)  Medications Ordered in ED Medications - No data to display   Initial Impression / Assessment and Plan / ED Course  I have reviewed the triage vital signs and the nursing notes.  Pertinent labs & imaging results that were available during my care of the patient were reviewed by me and considered in my medical decision making (see chart for details).        Patient presenting for evaluation of leg swelling, shortness breath, chest tightness.  Physical examination, she appears nontoxic.  However, she does appear fluid overloaded.  Concern for CHF.  Also consider kidney failure, ACS, viral illness.  Low suspicion for DVT, as she has had bilateral swelling without calf tenderness.  Will order labs, EKG, chest x-ray, and urine.  Labs reassuring, troponin negative.  BNP negative.  Creatinine stable.  Chest x-ray viewed interpreted by me, no pneumonia, pneumothorax, effusion, cardiomegaly. ekg without stemi.  Patient with continued nontoxic appearance, stable sats, and no shortness of breath with ambulation.  As such, consider early heart failure versus other causes for  peripheral edema.  At this time, I do not believe she has an acute or emergent condition requiring hospitalization.  Will trial a short course of Lasix for symptom improvement, and have patient follow-up with PCP.  Case discussed with attending, Dr. Rex Kras evaluated the patient.  At this time, patient appears safe for discharge.  Return precautions given.  Patient states she understands agrees to plan.   Final Clinical Impressions(s) / ED Diagnoses   Final diagnoses:  None    ED Discharge Orders    None       Franchot Heidelberg, PA-C 06/15/18 1535    Little, Wenda [place], MD 06/16/18 [phone]

## 2018-06-15 NOTE — Discharge Instructions (Signed)
Continue taking her medications as prescribed. Take Lasix as prescribed until days. Call your primary care doctor today or tomorrow to set up a follow-up appointment. Return to the emergency room if you develop fevers, shortness of breath, worsening chest pain, any new, worsening, concerning symptoms.

## 2018-06-15 NOTE — ED Notes (Signed)
Pt given sprite and crackers  With nurses permission she states she has updated her family hopes to go home after her lasix kicks in

## 2018-08-17 ENCOUNTER — Other Ambulatory Visit: Payer: Self-pay

## 2018-08-17 ENCOUNTER — Ambulatory Visit (HOSPITAL_COMMUNITY)
Admission: EM | Admit: 2018-08-17 | Discharge: 2018-08-17 | Disposition: A | Discharge status: Home / Self Care | Payer: Medicare Other | Attending: Urgent Care | Admitting: Urgent Care

## 2018-08-17 ENCOUNTER — Encounter (HOSPITAL_COMMUNITY): Payer: Self-pay | Admitting: Emergency Medicine

## 2018-08-17 DIAGNOSIS — J3089 Other allergic rhinitis: Secondary | ICD-10-CM

## 2018-08-17 DIAGNOSIS — J019 Acute sinusitis, unspecified: Secondary | ICD-10-CM | POA: Diagnosis not present

## 2018-08-17 MED ORDER — AMOXICILLIN 875 MG PO TABS: 875.0000 mg | ORAL_TABLET | Freq: Two times a day (BID) | ORAL | 0 refills | Status: DC

## 2018-08-17 NOTE — ED Triage Notes (Signed)
Sinus stuffiness and drainage, throat and sinus fullness, ear fullness.  Symptoms for 1 1/2 weeks Denies fever Patient 'feels" wheezing in left lung.

## 2018-08-17 NOTE — Discharge Instructions (Signed)
We will manage this for sinusitis. For sore throat or cough try using a honey-based tea. Use 3 teaspoons of honey with juice squeezed from half lemon. Place shaved pieces of ginger into 1/2-1 cup of water and warm over stove top. Then mix the ingredients and repeat every 4 hours as needed. Please take Tylenol 500mg  every 6 hours. Hydrate very well with at least 2 liters of water. Eat light meals such as soups to replenish electrolytes and soft fruits, veggies. Start an antihistamine like Zyrtec, Allegra or Claritin for postnasal drainage, sinus congestion.

## 2018-08-17 NOTE — ED Provider Notes (Addendum)
MRN: [id number] DOB: 09-Sep-1950  Subjective:   Alexandria Walsh is a 68 y.o. female presenting for 10-day history of acute onset worsening and persistent moderate sinus congestion/pain.  Patient has associated bilateral ear fullness, sore throat, postnasal drainage.  Patient has continue to take her Singulair and is tried other over-the-counter medications with minimal relief.  She recently quit smoking about 6 months ago.  Denies history of asthma.  No current facility-administered medications for this encounter.   Current Outpatient Medications:  .  albuterol (PROVENTIL HFA;VENTOLIN HFA) 108 (90 Base) MCG/ACT inhaler, Inhale 1-2 puffs into the lungs every 6 (six) hours as needed for wheezing or shortness of breath., Disp: 1 Inhaler, Rfl: 0 .  furosemide (LASIX) 20 MG tablet, Take 1 tablet (20 mg total) by mouth daily for 4 days., Disp: 4 tablet, Rfl: 0 .  gabapentin (NEURONTIN) 300 MG capsule, Take 300 mg by mouth as needed., Disp: , Rfl:  .  ipratropium (ATROVENT) 0.03 % nasal spray, Place 2 sprays into both nostrils as needed for rhinitis. , Disp: , Rfl:  .  levothyroxine (SYNTHROID, LEVOTHROID) 25 MCG tablet, Take 25 mcg by mouth daily before breakfast. , Disp: , Rfl:  .  lisinopril-hydrochlorothiazide (ZESTORETIC) 20-12.5 MG tablet, Take 1 tablet by mouth daily., Disp: , Rfl:  .  metFORMIN (GLUCOPHAGE) 500 MG tablet, Take 1 tablet (500 mg total) by mouth daily with breakfast., Disp: 90 tablet, Rfl: 3 .  montelukast (SINGULAIR) 10 MG tablet, Take 10 mg by mouth daily. , Disp: , Rfl:  .  naproxen sodium (ANAPROX DS) 550 MG tablet, Take 1 tablet (550 mg total) by mouth 2 (two) times daily with a meal. As needed for pain (Patient not taking: Reported on 06/15/2018), Disp: 30 tablet, Rfl: 0 .  omeprazole (PRILOSEC) 40 MG capsule, Take 40 mg by mouth as needed., Disp: , Rfl:  .  potassium chloride (K-DUR) 10 MEQ tablet, Take 10 mEq by mouth daily., Disp: , Rfl:    No Known Allergies  Past Medical  History:  Diagnosis Date  . Allergy   . Anemia   . Diabetes mellitus without complication ([place])   . Hypertension   . Thyroid disease      Past Surgical History:  Procedure Laterality Date  . [place]  . COLONOSCOPY  at age 83   in [place] exam per pt.   Marland Kitchen MYOMECTOMY  1987    Review of Systems  Constitutional: Negative for fever and malaise/fatigue.  HENT: Positive for congestion, sinus pain and sore throat. Negative for ear discharge, ear pain and tinnitus.   Eyes: Negative for blurred vision, double vision, discharge and redness.  Respiratory: Positive for cough (In an attempt to cough up sinus drainage) and wheezing (Mild intermittent). Negative for hemoptysis and shortness of breath.   Cardiovascular: Negative for chest pain.  Gastrointestinal: Negative for abdominal pain, diarrhea, nausea and vomiting.  Genitourinary: Negative for dysuria, flank pain and hematuria.  Musculoskeletal: Negative for myalgias.  Skin: Negative for rash.  Neurological: Negative for dizziness, weakness and headaches.  Psychiatric/Behavioral: Negative for depression and substance abuse.    Objective:   Vitals: BP 125/77 (BP Location: Right Arm)   Temp 98.2 F (36.8 C) (Oral)   Resp 18   SpO2 96%   Physical Exam Constitutional:      General: She is not in acute distress.    Appearance: Normal appearance. She is well-developed. She is obese. She is not ill-appearing, toxic-appearing or diaphoretic.  HENT:  Head: Normocephalic and atraumatic.     Comments: TMs opacified bilaterally, bilateral maxillary sinus tenderness, nasal mucosa is boggy and edematous.    Right Ear: Ear canal normal. No drainage or tenderness. No middle ear effusion. Tympanic membrane is not erythematous.     Left Ear: Ear canal normal. No drainage or tenderness.  No middle ear effusion. Tympanic membrane is not erythematous.     Nose: Congestion present. No rhinorrhea.      Mouth/Throat:     Mouth: Mucous membranes are moist. No oral lesions.     Pharynx: No pharyngeal swelling, oropharyngeal exudate, posterior oropharyngeal erythema or uvula swelling.     Tonsils: No tonsillar exudate or tonsillar abscesses.     Comments: Significant postnasal drainage. Eyes:     General: No scleral icterus.       Right eye: No discharge.        Left eye: No discharge.     Extraocular Movements:     Right eye: Normal extraocular motion.     Left eye: Normal extraocular motion.     Conjunctiva/sclera: Conjunctivae normal.     Pupils: Pupils are equal, round, and reactive to light.  Neck:     Musculoskeletal: Normal range of motion and neck supple.  Cardiovascular:     Rate and Rhythm: Normal rate.  Pulmonary:     Effort: Pulmonary effort is normal.  Lymphadenopathy:     Cervical: No cervical adenopathy.  Skin:    General: Skin is warm and dry.  Neurological:     General: No focal deficit present.     Mental Status: She is alert and oriented to person, place, and time.  Psychiatric:        Mood and Affect: Mood normal.        Behavior: Behavior normal.    Assessment and Plan :   1. Acute sinusitis, recurrence not specified, unspecified location   2. Allergic rhinitis due to other allergic trigger, unspecified seasonality    We will treat patient for sinusitis with amoxicillin.  Recommended that she continue her Singulair and add an oral antihistamine to address underlying allergic rhinitis.  Use supportive care otherwise.  Patient is to call the clinic and let us know if she ends up needing Diflucan if she develops antibiotic associated yeast infection. Counseled patient on potential for adverse effects with medications prescribed/recommended today, ER and return-to-clinic precautions discussed, patient verbalized understanding.      [name], [place] 08/17/18 [phone]

## 2018-08-18 ENCOUNTER — Telehealth: Payer: Self-pay | Admitting: *Deleted

## 2018-08-18 DIAGNOSIS — Z20822 Contact with and (suspected) exposure to covid-19: Secondary | ICD-10-CM

## 2018-08-18 NOTE — Telephone Encounter (Signed)
Scheduled patient for COVID 19 test 08/19/2018 at 11 am at [facility].  Testing protocol reviewed with patient.

## 2018-08-18 NOTE — Telephone Encounter (Signed)
-----   Message from [name], [place] sent at 08/17/2018 11:27 AM EDT ----- Regarding: Needs COVID testing

## 2018-08-19 ENCOUNTER — Other Ambulatory Visit: Payer: Medicare Other

## 2018-08-19 DIAGNOSIS — Z20822 Contact with and (suspected) exposure to covid-19: Secondary | ICD-10-CM

## 2018-09-04 ENCOUNTER — Other Ambulatory Visit: Payer: Self-pay

## 2018-09-04 ENCOUNTER — Emergency Department (HOSPITAL_COMMUNITY)
Admission: EM | Admit: 2018-09-04 | Discharge: 2018-09-04 | Disposition: A | Discharge status: Home / Self Care | Payer: Medicare Other | Attending: Emergency Medicine | Admitting: Emergency Medicine

## 2018-09-04 ENCOUNTER — Encounter (HOSPITAL_COMMUNITY): Payer: Self-pay | Admitting: Emergency Medicine

## 2018-09-04 DIAGNOSIS — Z87891 Personal history of nicotine dependence: Secondary | ICD-10-CM | POA: Insufficient documentation

## 2018-09-04 DIAGNOSIS — M25473 Effusion, unspecified ankle: Secondary | ICD-10-CM | POA: Insufficient documentation

## 2018-09-04 DIAGNOSIS — R0981 Nasal congestion: Secondary | ICD-10-CM

## 2018-09-04 DIAGNOSIS — E039 Hypothyroidism, unspecified: Secondary | ICD-10-CM | POA: Insufficient documentation

## 2018-09-04 DIAGNOSIS — E119 Type 2 diabetes mellitus without complications: Secondary | ICD-10-CM | POA: Insufficient documentation

## 2018-09-04 DIAGNOSIS — M25476 Effusion, unspecified foot: Secondary | ICD-10-CM | POA: Insufficient documentation

## 2018-09-04 DIAGNOSIS — Z20828 Contact with and (suspected) exposure to other viral communicable diseases: Secondary | ICD-10-CM | POA: Diagnosis not present

## 2018-09-04 DIAGNOSIS — Z79899 Other long term (current) drug therapy: Secondary | ICD-10-CM | POA: Diagnosis not present

## 2018-09-04 DIAGNOSIS — I10 Essential (primary) hypertension: Secondary | ICD-10-CM | POA: Insufficient documentation

## 2018-09-04 DIAGNOSIS — M25475 Effusion, left foot: Secondary | ICD-10-CM

## 2018-09-04 LAB — COMPREHENSIVE METABOLIC PANEL
ALT: 16 U/L (ref 0–44)
AST: 18 U/L (ref 15–41)
Albumin: 3.9 g/dL (ref 3.5–5.0)
Alkaline Phosphatase: 36 U/L — ABNORMAL LOW (ref 38–126)
Anion gap: 10 (ref 5–15)
BUN: 7 mg/dL — ABNORMAL LOW (ref 8–23)
CO2: 25 mmol/L (ref 22–32)
Calcium: 9 mg/dL (ref 8.9–10.3)
Chloride: 104 mmol/L (ref 98–111)
Creatinine, Ser: 0.81 mg/dL (ref 0.44–1.00)
GFR calc Af Amer: >60 mL/min (ref >60)
GFR calc non Af Amer: >60 mL/min (ref >60)
Glucose, Bld: 116 mg/dL — ABNORMAL HIGH (ref 70–99)
Potassium: 3.8 mmol/L (ref 3.5–5.1)
Sodium: 139 mmol/L (ref 135–145)
Total Bilirubin: 1 mg/dL (ref 0.3–1.2)
Total Protein: 7 g/dL (ref 6.5–8.1)

## 2018-09-04 LAB — CBC
HCT: 42.1 % (ref 36.0–46.0)
Hemoglobin: 13.3 g/dL (ref 12.0–15.0)
MCH: 29.5 pg (ref 26.0–34.0)
MCHC: 31.6 g/dL (ref 30.0–36.0)
MCV: 93.3 fL (ref 80.0–100.0)
Platelets: DECREASED 10*3/uL (ref 150–400)
RBC: 4.51 MIL/uL (ref 3.87–5.11)
RDW: 13.2 % (ref 11.5–15.5)
WBC: 4.1 10*3/uL (ref 4.0–10.5)
nRBC: 0 % (ref 0.0–0.2)

## 2018-09-04 LAB — SARS CORONAVIRUS 2 BY RT PCR (HOSPITAL ORDER, PERFORMED IN ~~LOC~~ HOSPITAL LAB): SARS Coronavirus 2: NEGATIVE

## 2018-09-04 MED ORDER — PSEUDOEPHEDRINE HCL 30 MG PO TABS
30.0000 mg | ORAL_TABLET | Freq: Once | ORAL | Status: AC
  Administered 2018-09-04: 12:00:00 30 mg via ORAL
  Filled 2018-09-04: qty 1

## 2018-09-04 MED ORDER — LORATADINE 10 MG PO TABS
10.0000 mg | ORAL_TABLET | Freq: Once | ORAL | Status: AC
  Administered 2018-09-04: 10 mg via ORAL
  Filled 2018-09-04: qty 1

## 2018-09-04 NOTE — Discharge Instructions (Addendum)
It was our pleasure to provide your ER care today - we hope that you feel better.  Your lab work looks good/normal. Your covid test is negative.   For sinus congestion, try either zyrtec-d or claritin-d as need (available over the counter).  For foot/ankle swelling, limit salt intake, and elevate feet.  Follow up with primary care doctor in the next 1-2 weeks.   Return to ER if worse, new symptoms, fevers, new or severe pain, increased trouble breathing, other concern.

## 2018-09-04 NOTE — ED Triage Notes (Signed)
Pt. Stated, Alexandria Walsh got a sinus infection for 2 weeks  And also my legs and feet have been swelling for a week.

## 2018-09-04 NOTE — ED Provider Notes (Signed)
[facility] EMERGENCY DEPARTMENT Provider Note   CSN: [id number] Arrival date & time: 09/04/18  6712     History   Chief Complaint Chief Complaint  Patient presents with  . Nasal Congestion  . Facial Pain  . Leg Swelling    HPI Alexandria Walsh is a 68 y.o. female.     Patient c/o nasal congestion and drainage, as well as bil foot/ankle swelling. Patient indicates symptoms began a couple weeks ago, was dx with sinusitis, states completed amoxicillin - states symptoms improved but not resolved. Denies sinus pain. No hearing loss or tinnitus. +scratchy throat, and feels as if 'glands' swollen. Denies trouble breathing or swallowing. Occasional non prod cough. No cp or sob. Denies fever or chills. No known covid+ exposure. Denies abd pain or nvd. Mild bil foot/ankle swelling. Denies leg or calf pain. No orthopnea/pnd.    The history is provided by the patient.    Past Medical History:  Diagnosis Date  . Allergy   . Anemia   . Diabetes mellitus without complication ([place])   . Hypertension   . Thyroid disease     Patient Active Problem List   Diagnosis Date Noted  . Bleeding in mouth 12/14/2015  . Chronic pain of both knees 12/14/2015  . HTN (hypertension) 10/24/2013  . Unspecified hypothyroidism 10/24/2013  . Esophageal reflux 10/24/2013    Past Surgical History:  Procedure Laterality Date  . [place]  . COLONOSCOPY  at age 65   in [place] exam per pt.   Marland Kitchen MYOMECTOMY  1987     OB History   No obstetric history on file.      Home Medications    Prior to Admission medications   Medication Sig Start Date End Date Taking? Authorizing Provider  albuterol (PROVENTIL HFA;VENTOLIN HFA) 108 (90 Base) MCG/ACT inhaler Inhale 1-2 puffs into the lungs every 6 (six) hours as needed for wheezing or shortness of breath. 10/29/17   Loura Halt A, NP  amoxicillin (AMOXIL) 875 MG tablet Take 1 tablet (875 mg total) by mouth 2  (two) times daily with a meal. 08/17/18   [name], PA-C  furosemide (LASIX) 20 MG tablet Take 1 tablet (20 mg total) by mouth daily for 4 days. 06/15/18 06/19/18  Caccavale, Sophia, PA-C  gabapentin (NEURONTIN) 300 MG capsule Take 300 mg by mouth as needed. 04/27/18   [provider]  ipratropium (ATROVENT) 0.03 % nasal spray Place 2 sprays into both nostrils as needed for rhinitis.  03/18/17   [provider]  levothyroxine (SYNTHROID, LEVOTHROID) 25 MCG tablet Take 25 mcg by mouth daily before breakfast.  10/22/17   [provider]  lisinopril-hydrochlorothiazide (ZESTORETIC) 20-12.5 MG tablet Take 1 tablet by mouth daily. 05/27/18   [provider]  metFORMIN (GLUCOPHAGE) 500 MG tablet Take 1 tablet (500 mg total) by mouth daily with breakfast. 09/06/15   [name], PA-C  montelukast (SINGULAIR) 10 MG tablet Take 10 mg by mouth daily.     [provider]  omeprazole (PRILOSEC) 40 MG capsule Take 40 mg by mouth as needed. 04/27/18   [provider]  potassium chloride (K-DUR) 10 MEQ tablet Take 10 mEq by mouth daily.    [provider]    Family History Family History  Problem Relation Age of Onset  . Hypertension Mother   . Hypertension Father   . Heart disease Other   . Diabetes Maternal Aunt   . Heart disease Maternal Uncle   .  Colon cancer Neg Hx   . Colon polyps Neg Hx   . Esophageal cancer Neg Hx   . Rectal cancer Neg Hx   . Stomach cancer Neg Hx     Social History Social History   Tobacco Use  . Smoking status: Former Smoker    Types: Cigarettes    Quit date: 09/18/2017    Years since quitting: 0.9  . Smokeless tobacco: Never Used  Substance Use Topics  . Alcohol use: Yes    Alcohol/week: 0.0 standard drinks    Comment: occas  . Drug use: No     Allergies   Patient has no known allergies.   Review of Systems Review of Systems  Constitutional: Negative for fever.  HENT: Positive for congestion and  rhinorrhea. Negative for sore throat.   Eyes: Negative for redness.  Respiratory: Negative for shortness of breath.   Cardiovascular: Negative for chest pain.  Gastrointestinal: Negative for abdominal pain, diarrhea and vomiting.  Genitourinary: Negative for flank pain.  Musculoskeletal: Negative for back pain and neck pain.  Skin: Negative for rash.  Neurological: Negative for headaches.  Hematological: Does not bruise/bleed easily.  Psychiatric/Behavioral: Negative for confusion.     Physical Exam Updated Vital Signs BP (!) 153/71   Pulse (!) 58   Temp 98.1 F (36.7 C) (Oral)   Resp 18   SpO2 97%   Physical Exam Vitals signs and nursing note reviewed.  Constitutional:      Appearance: Normal appearance. She is well-developed.  HENT:     Head: Atraumatic.     Right Ear: Tympanic membrane normal.     Left Ear: Tympanic membrane normal.     Nose: Congestion present.     Mouth/Throat:     Mouth: Mucous membranes are moist.  Eyes:     General: No scleral icterus.    Conjunctiva/sclera: Conjunctivae normal.     Pupils: Pupils are equal, round, and reactive to light.  Neck:     Musculoskeletal: Normal range of motion and neck supple. No neck rigidity or muscular tenderness.     Trachea: No tracheal deviation.     Comments: No stiffness or rigidity.  Cardiovascular:     Rate and Rhythm: Normal rate and regular rhythm.     Pulses: Normal pulses.     Heart sounds: Normal heart sounds. No murmur. No friction rub. No gallop.   Pulmonary:     Effort: Pulmonary effort is normal. No respiratory distress.     Breath sounds: Normal breath sounds.  Abdominal:     General: Bowel sounds are normal. There is no distension.     Palpations: Abdomen is soft.     Tenderness: There is no abdominal tenderness. There is no guarding.  Genitourinary:    Comments: No cva tenderness.  Musculoskeletal:     Comments: Mild symmetric, bil foot/ankle swelling.   Skin:    General: Skin is warm  and dry.     Findings: No rash.  Neurological:     Mental Status: She is alert.     Comments: Alert, speech normal.   Psychiatric:        Mood and Affect: Mood normal.      ED Treatments / Results  Labs (all labs ordered are listed, but only abnormal results are displayed) Results for orders placed or performed during the hospital encounter of 09/04/18  SARS Coronavirus 2 (CEPHEID - Performed in [facility] hospital lab), [facility] Order   Specimen: Nasopharyngeal Swab  Result Value  Ref Range   SARS Coronavirus 2 NEGATIVE NEGATIVE  Comprehensive metabolic panel  Result Value Ref Range   Sodium 139 135 - 145 mmol/L   Potassium 3.8 3.5 - 5.1 mmol/L   Chloride 104 98 - 111 mmol/L   CO2 25 22 - 32 mmol/L   Glucose, Bld 116 (H) 70 - 99 mg/dL   BUN 7 (L) 8 - 23 mg/dL   Creatinine, Ser 0.81 0.44 - 1.00 mg/dL   Calcium 9.0 8.9 - 10.3 mg/dL   Total Protein 7.0 6.5 - 8.1 g/dL   Albumin 3.9 3.5 - 5.0 g/dL   AST 18 15 - 41 U/L   ALT 16 0 - 44 U/L   Alkaline Phosphatase 36 (L) 38 - 126 U/L   Total Bilirubin 1.0 0.3 - 1.2 mg/dL   GFR calc non Af Amer >60 >60 mL/min   GFR calc Af Amer >60 >60 mL/min   Anion gap 10 5 - 15  CBC  Result Value Ref Range   WBC 4.1 4.0 - 10.5 K/uL   RBC 4.51 3.87 - 5.11 MIL/uL   Hemoglobin 13.3 12.0 - 15.0 g/dL   HCT 42.1 36.0 - 46.0 %   MCV 93.3 80.0 - 100.0 fL   MCH 29.5 26.0 - 34.0 pg   MCHC 31.6 30.0 - 36.0 g/dL   RDW 13.2 11.5 - 15.5 %   Platelets  150 - 400 K/uL    PLATELET CLUMPS NOTED ON SMEAR, COUNT APPEARS DECREASED   nRBC 0.0 0.0 - 0.2 %    EKG None  Radiology No results found.  Procedures Procedures (including critical care time)  Medications Ordered in ED Medications - No data to display   Initial Impression / Assessment and Plan / ED Course  I have reviewed the triage vital signs and the nursing notes.  Pertinent labs & imaging results that were available during my care of the patient were reviewed by me and considered in my  medical decision making (see chart for details).  Labs sent.   Reviewed nursing notes and prior charts for additional history.   Labs reviewed by me - chem normal. Wbc normal. covid test negative.  Alexandria Walsh was evaluated in Emergency Department on 09/04/2018 for the symptoms described in the history of present illness. She was evaluated in the context of the global COVID-19 pandemic, which necessitated consideration that the patient might be at risk for infection with the SARS-CoV-2 virus that causes COVID-19. Institutional protocols and algorithms that pertain to the evaluation of patients at risk for COVID-19 are in a state of rapid change based on information released by regulatory bodies including the CDC and federal and state organizations. These policies and algorithms were followed during the patient's care in the ED.  For sinus congestion, will recommend zyrtec-d.   Also recommend low sodium diet, leg elevattion.  rec pcp f/u.   Return precautions provided.    Final Clinical Impressions(s) / ED Diagnoses   Final diagnoses:  None    ED Discharge Orders    None       Lajean Saver, MD 09/04/18 1202

## 2019-02-01 ENCOUNTER — Ambulatory Visit: Payer: Medicare Other | Attending: Internal Medicine

## 2019-02-01 DIAGNOSIS — Z20822 Contact with and (suspected) exposure to covid-19: Secondary | ICD-10-CM

## 2019-02-03 LAB — NOVEL CORONAVIRUS, NAA: SARS-CoV-2, NAA: DETECTED — AB

## 2019-07-07 ENCOUNTER — Other Ambulatory Visit: Payer: Self-pay

## 2019-07-07 ENCOUNTER — Emergency Department (HOSPITAL_COMMUNITY)
Admission: EM | Admit: 2019-07-07 | Discharge: 2019-07-08 | Disposition: A | Discharge status: Home / Self Care | Payer: Medicare Other | Attending: Emergency Medicine | Admitting: Emergency Medicine

## 2019-07-07 ENCOUNTER — Emergency Department (HOSPITAL_COMMUNITY): Payer: Medicare Other

## 2019-07-07 ENCOUNTER — Encounter (HOSPITAL_COMMUNITY): Payer: Self-pay | Admitting: Emergency Medicine

## 2019-07-07 DIAGNOSIS — E039 Hypothyroidism, unspecified: Secondary | ICD-10-CM | POA: Insufficient documentation

## 2019-07-07 DIAGNOSIS — R221 Localized swelling, mass and lump, neck: Secondary | ICD-10-CM | POA: Diagnosis present

## 2019-07-07 DIAGNOSIS — Z87891 Personal history of nicotine dependence: Secondary | ICD-10-CM | POA: Diagnosis not present

## 2019-07-07 DIAGNOSIS — E119 Type 2 diabetes mellitus without complications: Secondary | ICD-10-CM | POA: Insufficient documentation

## 2019-07-07 DIAGNOSIS — Z7984 Long term (current) use of oral hypoglycemic drugs: Secondary | ICD-10-CM | POA: Insufficient documentation

## 2019-07-07 DIAGNOSIS — Z79899 Other long term (current) drug therapy: Secondary | ICD-10-CM | POA: Insufficient documentation

## 2019-07-07 DIAGNOSIS — I10 Essential (primary) hypertension: Secondary | ICD-10-CM | POA: Diagnosis not present

## 2019-07-07 DIAGNOSIS — K1120 Sialoadenitis, unspecified: Secondary | ICD-10-CM | POA: Diagnosis not present

## 2019-07-07 LAB — CBC WITH DIFFERENTIAL/PLATELET
Abs Immature Granulocytes: 0.02 10*3/uL (ref 0.00–0.07)
Basophils Absolute: 0 10*3/uL (ref 0.0–0.1)
Basophils Relative: 0 %
Eosinophils Absolute: 0.1 10*3/uL (ref 0.0–0.5)
Eosinophils Relative: 1 %
HCT: 42.3 % (ref 36.0–46.0)
Hemoglobin: 13.6 g/dL (ref 12.0–15.0)
Immature Granulocytes: 0 %
Lymphocytes Relative: 16 %
Lymphs Abs: 1.6 10*3/uL (ref 0.7–4.0)
MCH: 29.4 pg (ref 26.0–34.0)
MCHC: 32.2 g/dL (ref 30.0–36.0)
MCV: 91.6 fL (ref 80.0–100.0)
Monocytes Absolute: 0.6 10*3/uL (ref 0.1–1.0)
Monocytes Relative: 6 %
Neutro Abs: 7.3 10*3/uL (ref 1.7–7.7)
Neutrophils Relative %: 77 %
Platelets: 202 10*3/uL (ref 150–400)
RBC: 4.62 MIL/uL (ref 3.87–5.11)
RDW: 13.1 % (ref 11.5–15.5)
WBC: 9.6 10*3/uL (ref 4.0–10.5)
nRBC: 0 % (ref 0.0–0.2)

## 2019-07-07 LAB — COMPREHENSIVE METABOLIC PANEL
ALT: 18 U/L (ref 0–44)
AST: 19 U/L (ref 15–41)
Albumin: 4.4 g/dL (ref 3.5–5.0)
Alkaline Phosphatase: 34 U/L — ABNORMAL LOW (ref 38–126)
Anion gap: 10 (ref 5–15)
BUN: 10 mg/dL (ref 8–23)
CO2: 27 mmol/L (ref 22–32)
Calcium: 9.3 mg/dL (ref 8.9–10.3)
Chloride: 102 mmol/L (ref 98–111)
Creatinine, Ser: 0.82 mg/dL (ref 0.44–1.00)
GFR calc Af Amer: >60 mL/min (ref >60)
GFR calc non Af Amer: >60 mL/min (ref >60)
Glucose, Bld: 119 mg/dL — ABNORMAL HIGH (ref 70–99)
Potassium: 3.6 mmol/L (ref 3.5–5.1)
Sodium: 139 mmol/L (ref 135–145)
Total Bilirubin: 1 mg/dL (ref 0.3–1.2)
Total Protein: 7.5 g/dL (ref 6.5–8.1)

## 2019-07-07 LAB — TSH: TSH: 1.76 u[IU]/mL (ref 0.350–4.500)

## 2019-07-07 LAB — T4, FREE: Free T4: 0.86 ng/dL (ref 0.61–1.12)

## 2019-07-07 MED ORDER — SODIUM CHLORIDE 0.9 % IV BOLUS
1000.0000 mL | Freq: Once | INTRAVENOUS | Status: AC
  Administered 2019-07-07: 1000 mL via INTRAVENOUS

## 2019-07-07 MED ORDER — IOHEXOL 300 MG/ML  SOLN
75.0000 mL | Freq: Once | INTRAMUSCULAR | Status: AC | PRN
  Administered 2019-07-07: 75 mL via INTRAVENOUS

## 2019-07-07 MED ORDER — AMOXICILLIN-POT CLAVULANATE 875-125 MG PO TABS: 1.0000 | ORAL_TABLET | Freq: Two times a day (BID) | ORAL | 0 refills | Status: DC

## 2019-07-07 MED ORDER — SODIUM CHLORIDE 0.9 % IV SOLN
1.5000 g | Freq: Once | INTRAVENOUS | Status: AC
  Administered 2019-07-07: 1.5 g via INTRAVENOUS
  Filled 2019-07-07: qty 4

## 2019-07-07 NOTE — ED Triage Notes (Signed)
Pt reports sore throat, began having swelling to L side of neck x2 weeks but last night area grew from marble sized to golf ball size. Pt seen by pcp at [facility], sent here for further eval by ENT. Airway intact, no drooling.

## 2019-07-07 NOTE — ED Provider Notes (Signed)
[facility] Provider Note   CSN: [id number] Arrival date & time: 07/07/19  1137     History Chief Complaint  Patient presents with  . neck swelling    Alexandria Walsh is a 69 y.o. female.  69 yo F with a chief complaints of swelling to her neck.  She has had this happen in the past.  Resolved spontaneously.  She is headed there this time for about 2 weeks but rapidly worsening over the past 48 hours.  Saw her family doctor in the office and they gave her a shot of Rocephin and then sent her here for CT scan and possible ENT involvement.  States that she has had some painful swallowing.  Denies pain to the ear denies pain to the jaw denies fever.  Has some pain with palpation twisting.  The history is provided by the patient.  Illness Severity:  Moderate Onset quality:  Gradual Duration:  2 days Timing:  Constant Progression:  Worsening Chronicity:  New Associated symptoms: no chest pain, no congestion, no fever, no headaches, no myalgias, no nausea, no rhinorrhea, no shortness of breath, no vomiting and no wheezing        Past Medical History:  Diagnosis Date  . Allergy   . Anemia   . Diabetes mellitus without complication ([place])   . Hypertension   . Thyroid disease     Patient Active Problem List   Diagnosis Date Noted  . Bleeding in mouth 12/14/2015  . Chronic pain of both knees 12/14/2015  . HTN (hypertension) 10/24/2013  . Unspecified hypothyroidism 10/24/2013  . Esophageal reflux 10/24/2013    Past Surgical History:  Procedure Laterality Date  . Amenia  . COLONOSCOPY  at age 66   in [place] exam per pt.   Marland Kitchen MYOMECTOMY  1987     OB History   No obstetric history on file.     Family History  Problem Relation Age of Onset  . Hypertension Mother   . Hypertension Father   . Heart disease Other   . Diabetes Maternal Aunt   . Heart disease Maternal Uncle   . Colon cancer Neg Hx    . Colon polyps Neg Hx   . Esophageal cancer Neg Hx   . Rectal cancer Neg Hx   . Stomach cancer Neg Hx     Social History   Tobacco Use  . Smoking status: Former Smoker    Types: Cigarettes    Quit date: 09/18/2017    Years since quitting: 1.8  . Smokeless tobacco: Never Used  Substance Use Topics  . Alcohol use: Yes    Alcohol/week: 0.0 standard drinks    Comment: occas  . Drug use: No    Home Medications Prior to Admission medications   Medication Sig Start Date End Date Taking? Authorizing Provider  albuterol (PROVENTIL HFA;VENTOLIN HFA) 108 (90 Base) MCG/ACT inhaler Inhale 1-2 puffs into the lungs every 6 (six) hours as needed for wheezing or shortness of breath. 10/29/17   Loura Halt A, NP  amoxicillin (AMOXIL) 875 MG tablet Take 1 tablet (875 mg total) by mouth 2 (two) times daily with a meal. 08/17/18   [name], PA-C  amoxicillin-clavulanate (AUGMENTIN) 875-125 MG tablet Take 1 tablet by mouth every 12 (twelve) hours. 07/07/19   Deno Etienne, DO  furosemide (LASIX) 20 MG tablet Take 1 tablet (20 mg total) by mouth daily for 4 days. 06/15/18 06/19/18  Caccavale, Sophia, PA-C  gabapentin (NEURONTIN) 300 MG capsule Take 300 mg by mouth as needed. 04/27/18   [provider]  ipratropium (ATROVENT) 0.03 % nasal spray Place 2 sprays into both nostrils as needed for rhinitis.  03/18/17   [provider]  levothyroxine (SYNTHROID, LEVOTHROID) 25 MCG tablet Take 25 mcg by mouth daily before breakfast.  10/22/17   [provider]  lisinopril-hydrochlorothiazide (ZESTORETIC) 20-12.5 MG tablet Take 1 tablet by mouth daily. 05/27/18   [provider]  metFORMIN (GLUCOPHAGE) 500 MG tablet Take 1 tablet (500 mg total) by mouth daily with breakfast. 09/06/15   [name], PA-C  montelukast (SINGULAIR) 10 MG tablet Take 10 mg by mouth daily.     [provider]  omeprazole (PRILOSEC) 40 MG capsule Take 40 mg by mouth as needed. 04/27/18   [provider]  potassium chloride (K-DUR) 10 MEQ tablet Take 10 mEq by mouth daily.    [provider]    Allergies    Patient has no known allergies.  Review of Systems   Review of Systems  Constitutional: Negative for chills and fever.  HENT: Positive for facial swelling. Negative for congestion and rhinorrhea.   Eyes: Negative for redness and visual disturbance.  Respiratory: Negative for shortness of breath and wheezing.   Cardiovascular: Negative for chest pain and palpitations.  Gastrointestinal: Negative for nausea and vomiting.  Genitourinary: Negative for dysuria and urgency.  Musculoskeletal: Negative for arthralgias and myalgias.  Skin: Negative for pallor and wound.  Neurological: Negative for dizziness and headaches.    Physical Exam Updated Vital Signs BP 134/68 (BP Location: Right Arm)   Pulse 71   Temp 98.6 F (37 C) (Oral)   Resp 18   Ht 5\' 2"  (1.575 m)   Wt 95.7 kg   SpO2 98%   BMI 38.59 kg/m   Physical Exam Vitals and nursing note reviewed.  Constitutional:      General: She is not in acute distress.    Appearance: She is well-developed. She is not diaphoretic.  HENT:     Head: Normocephalic and atraumatic.     Mouth/Throat:     Comments: Left-sided submandibular swelling.  Mildly tender.  No obvious fluctuance.  Diffuse dental caries but no obvious dental pain.  No intraoral or area of fluctuance.  No sublingual swelling.  Tolerating secretions without difficulty.  Full range of motion of the neck.  Left TM with small effusion.  No bulging. Eyes:     Pupils: Pupils are equal, round, and reactive to light.  Cardiovascular:     Rate and Rhythm: Normal rate and regular rhythm.     Heart sounds: No murmur. No friction rub. No gallop.   Pulmonary:     Effort: Pulmonary effort is normal.     Breath sounds: No wheezing or rales.  Abdominal:     General: There is no distension.     Palpations: Abdomen is soft.     Tenderness: There is no  abdominal tenderness.  Musculoskeletal:        General: No tenderness.     Cervical back: Normal range of motion and neck supple.  Skin:    General: Skin is warm and dry.  Neurological:     Mental Status: She is alert and oriented to person, place, and time.  Psychiatric:        Behavior: Behavior normal.     ED Results / Procedures / Treatments   Labs (all labs ordered are listed, but only  abnormal results are displayed) Labs Reviewed  COMPREHENSIVE METABOLIC PANEL - Abnormal; Notable for the following components:      Result Value   Glucose, Bld 119 (*)    Alkaline Phosphatase 34 (*)    All other components within normal limits  CBC WITH DIFFERENTIAL/PLATELET  TSH  T4, FREE  T3, FREE    EKG None  Radiology CT Soft Tissue Neck W Contrast  Result Date: 07/07/2019 CLINICAL DATA:  Swelling left face EXAM: CT NECK WITH CONTRAST TECHNIQUE: Multidetector CT imaging of the neck was performed using the standard protocol following the bolus administration of intravenous contrast. CONTRAST:  53mL OMNIPAQUE IOHEXOL 300 MG/ML  SOLN COMPARISON:  None. FINDINGS: Pharynx and larynx: Left parapharyngeal edema related to left submandibular gland inflammation. There is mild airway narrowing. Epiglottis and larynx normal. Salivary glands: Enlargement and hyperenhancement of the left submandibular gland with extensive surrounding edema. Multiple very large calculi in the left submandibular gland measuring up to 15 mm in diameter. Stones extend into the proximal duct with obstruction of the gland and inflammation. No abscess. No calculi in the right submandibular gland although the ducts are mildly dilated. No acute inflammation or mass. Normal parotid bilaterally. Thyroid: Negative Lymph nodes: 12 mm left level 2 lymph node. Small posterior lymph nodes on the left. Small lymph nodes on the right. These are likely reactive lymph nodes. Vascular: Normal vascular enhancement. Limited intracranial:  Negative Visualized orbits: Negative Mastoids and visualized paranasal sinuses: Paranasal sinuses clear. Mastoid clear. Skeleton: No acute skeletal abnormality. Disc degeneration and spurring C6-7. Upper chest: Lung apices clear bilaterally. Other: None IMPRESSION: 1. Multiple very large calculi left submandibular gland up to 15 mm in diameter. There is diffuse edema and enlargement of the left submandibular gland due to inflammation. There is surrounding edema extending into the parapharyngeal space with mild narrowing of the airway. Electronically Signed   By: Franchot Gallo M.D.   On: 07/07/2019 19:25    Procedures Procedures (including critical care time)  Medications Ordered in ED Medications  iohexol (OMNIPAQUE) 300 MG/ML solution 75 mL (75 mLs Intravenous Contrast Given 07/07/19 1838)  ampicillin-sulbactam (UNASYN) 1.5 g in sodium chloride 0.9 % 100 mL IVPB (0 g Intravenous Stopped 07/07/19 2259)  sodium chloride 0.9 % bolus 1,000 mL (1,000 mLs Intravenous New Bag/Given 07/07/19 2136)    ED Course  I have reviewed the triage vital signs and the nursing notes.  Pertinent labs & imaging results that were available during my care of the patient were reviewed by me and considered in my medical decision making (see chart for details).    MDM Rules/Calculators/A&P                      69 yo F with a chief complaints of left-sided neck swelling.  This is a recurrent issue for her.  It happened in the past and it was thought to be a lymph node and resolved spontaneously.  This time she thought it would resolve spontaneously but worsened over the past 48 hours.  Now is significantly swollen.  She went to her family doctor who gave her a dose of Rocephin and sent here for evaluation.  CT scan concerning for a salivary duct stone with surrounding inflammation.  I discussed this with Dr. Leonarda Salon, ENT recommended giving a bolus of IV fluids and IV antibiotics and then discharging the patient on  Augmentin.  He will see her in the office next week.  11:30 PM:  I  have discussed the diagnosis/risks/treatment options with the patient and believe the pt to be eligible for discharge home to follow-up with ENT. We also discussed returning to the ED immediately if new or worsening sx occur. We discussed the sx which are most concerning (e.g., sudden worsening pain, fever, inability to tolerate by mouth, difficulty breathing) that necessitate immediate return. Medications administered to the patient during their visit and any new prescriptions provided to the patient are listed below.  Medications given during this visit Medications  iohexol (OMNIPAQUE) 300 MG/ML solution 75 mL (75 mLs Intravenous Contrast Given 07/07/19 1838)  ampicillin-sulbactam (UNASYN) 1.5 g in sodium chloride 0.9 % 100 mL IVPB (0 g Intravenous Stopped 07/07/19 2259)  sodium chloride 0.9 % bolus 1,000 mL (1,000 mLs Intravenous New Bag/Given 07/07/19 2136)     The patient appears reasonably screen and/or stabilized for discharge and I doubt any other medical condition or other [facility] requiring further screening, evaluation, or treatment in the ED at this time prior to discharge.  Final Clinical Impression(s) / ED Diagnoses Final diagnoses:  Sialoadenitis    Rx / DC Orders ED Discharge Orders         Ordered    amoxicillin-clavulanate (AUGMENTIN) 875-125 MG tablet  Every 12 hours     07/07/19 [place], [place], DO 07/07/19 2330

## 2019-07-07 NOTE — Discharge Instructions (Signed)
Warm compresses to the area at least 4 times a day.  It also can sometimes help to massage the area.  Please drink plenty of fluids.  You also need to suck on sour candy at least 4 times a day.  Please call the ear nose and throat doctor that is listed in this paperwork on Monday.  He plans to see you sometime this week to reassess.  Please return to the ED for difficulty swallowing or breathing or if you develop a fever.

## 2019-07-07 NOTE — ED Notes (Signed)
Lt neck swelling for 2 weeks  Worse today  She was seen by her regular doctor and sent here to see an ent doctor   Some pain with swallowing

## 2019-07-07 NOTE — ED Provider Notes (Signed)
Patient placed in Quick Look pathway, seen and evaluated   Chief Complaint: Left neck swelling  HPI: 2 weeks ago patient noticed swelling to the left side of her neck, has since creased in size now approximately the size of a baseball.  Sent in by patient hematuria evaluation.  She reports that she received a shot of Rocephin prior to arrival.  ROS: + Swelling of the neck, pain with swallowing           -Fever, nausea/vomiting, difficulty breathing, drooling  Physical Exam:   Gen: No distress  Neuro: Awake and Alert  Skin: Warm    Focused Exam: Significant swelling of the left lateral neck.. The patient is in control of secretions. No stridor. Soft palate rises symmetrically. No tonsillar erythema, swelling or exudates. Tongue protrusion is normal, floor of mouth is soft. No trismus.  Poor dentition overall, no obvious dental abscess. Mucus membranes moist. No pallor noted.  CBC, CMP, TSH, T3, T4 and CT soft tissue neck ordered.  Patient's airway is intact, no evidence of compromise at this time.  Appears stable for continued treatment in main ED.  Initiation of care has begun. The patient has been counseled on the process, plan, and necessity for staying for the completion/evaluation, and the remainder of the medical screening examination    Gari Crown 07/07/19 1245    Noemi Chapel, MD 07/08/19 [id number]

## 2019-07-08 LAB — T3, FREE: T3, Free: 2.6 pg/mL (ref 2.0–4.4)

## 2019-09-18 IMAGING — CR CHEST - 2 VIEW
2 series · 2 of 2 positions shown · non-contrast
Comparison: PA and lateral chest 02/05/2013.

CLINICAL DATA: Left chest pain. Bilateral lower extremity swelling.

EXAM:
CHEST - 2 VIEW

[chest pa]
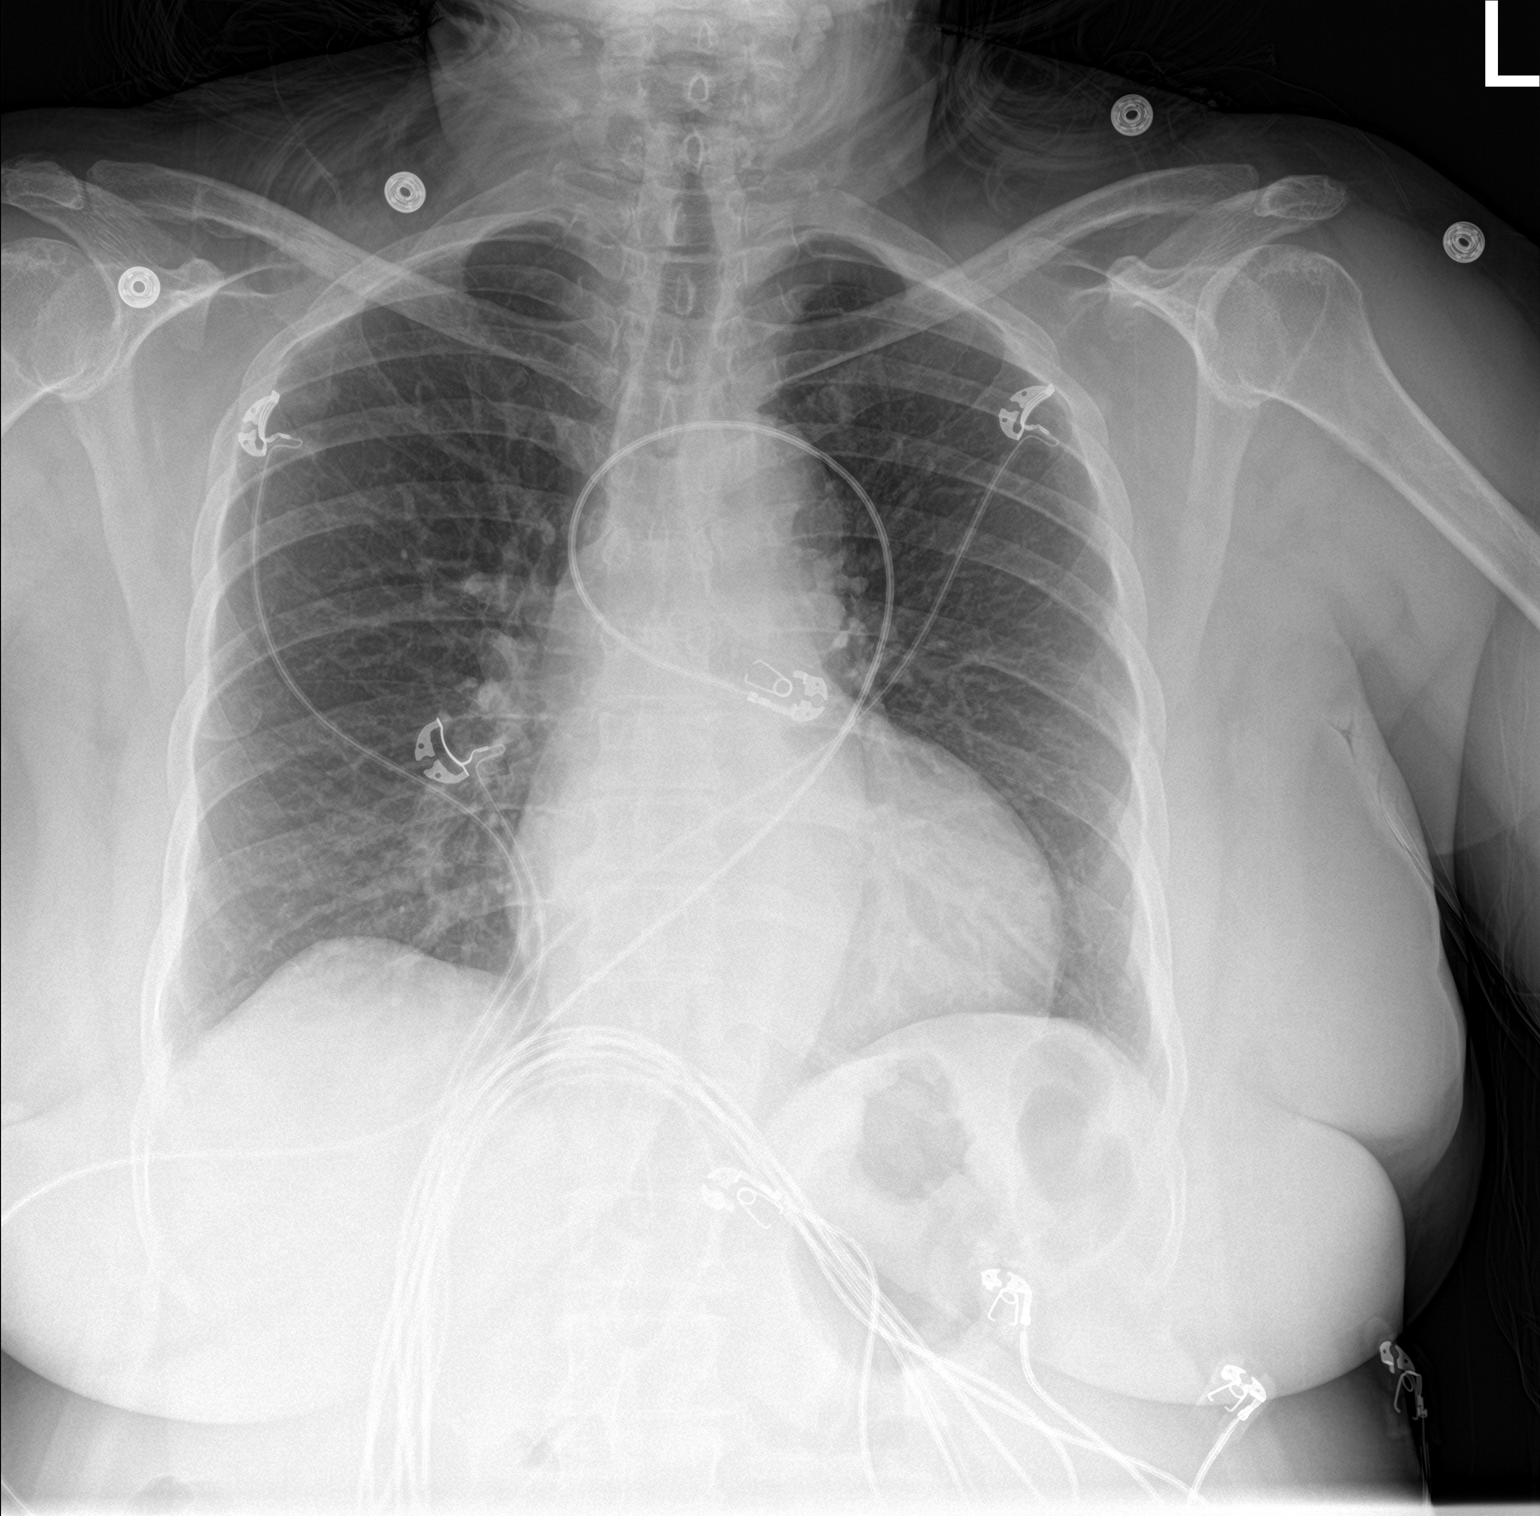

[chest lat]
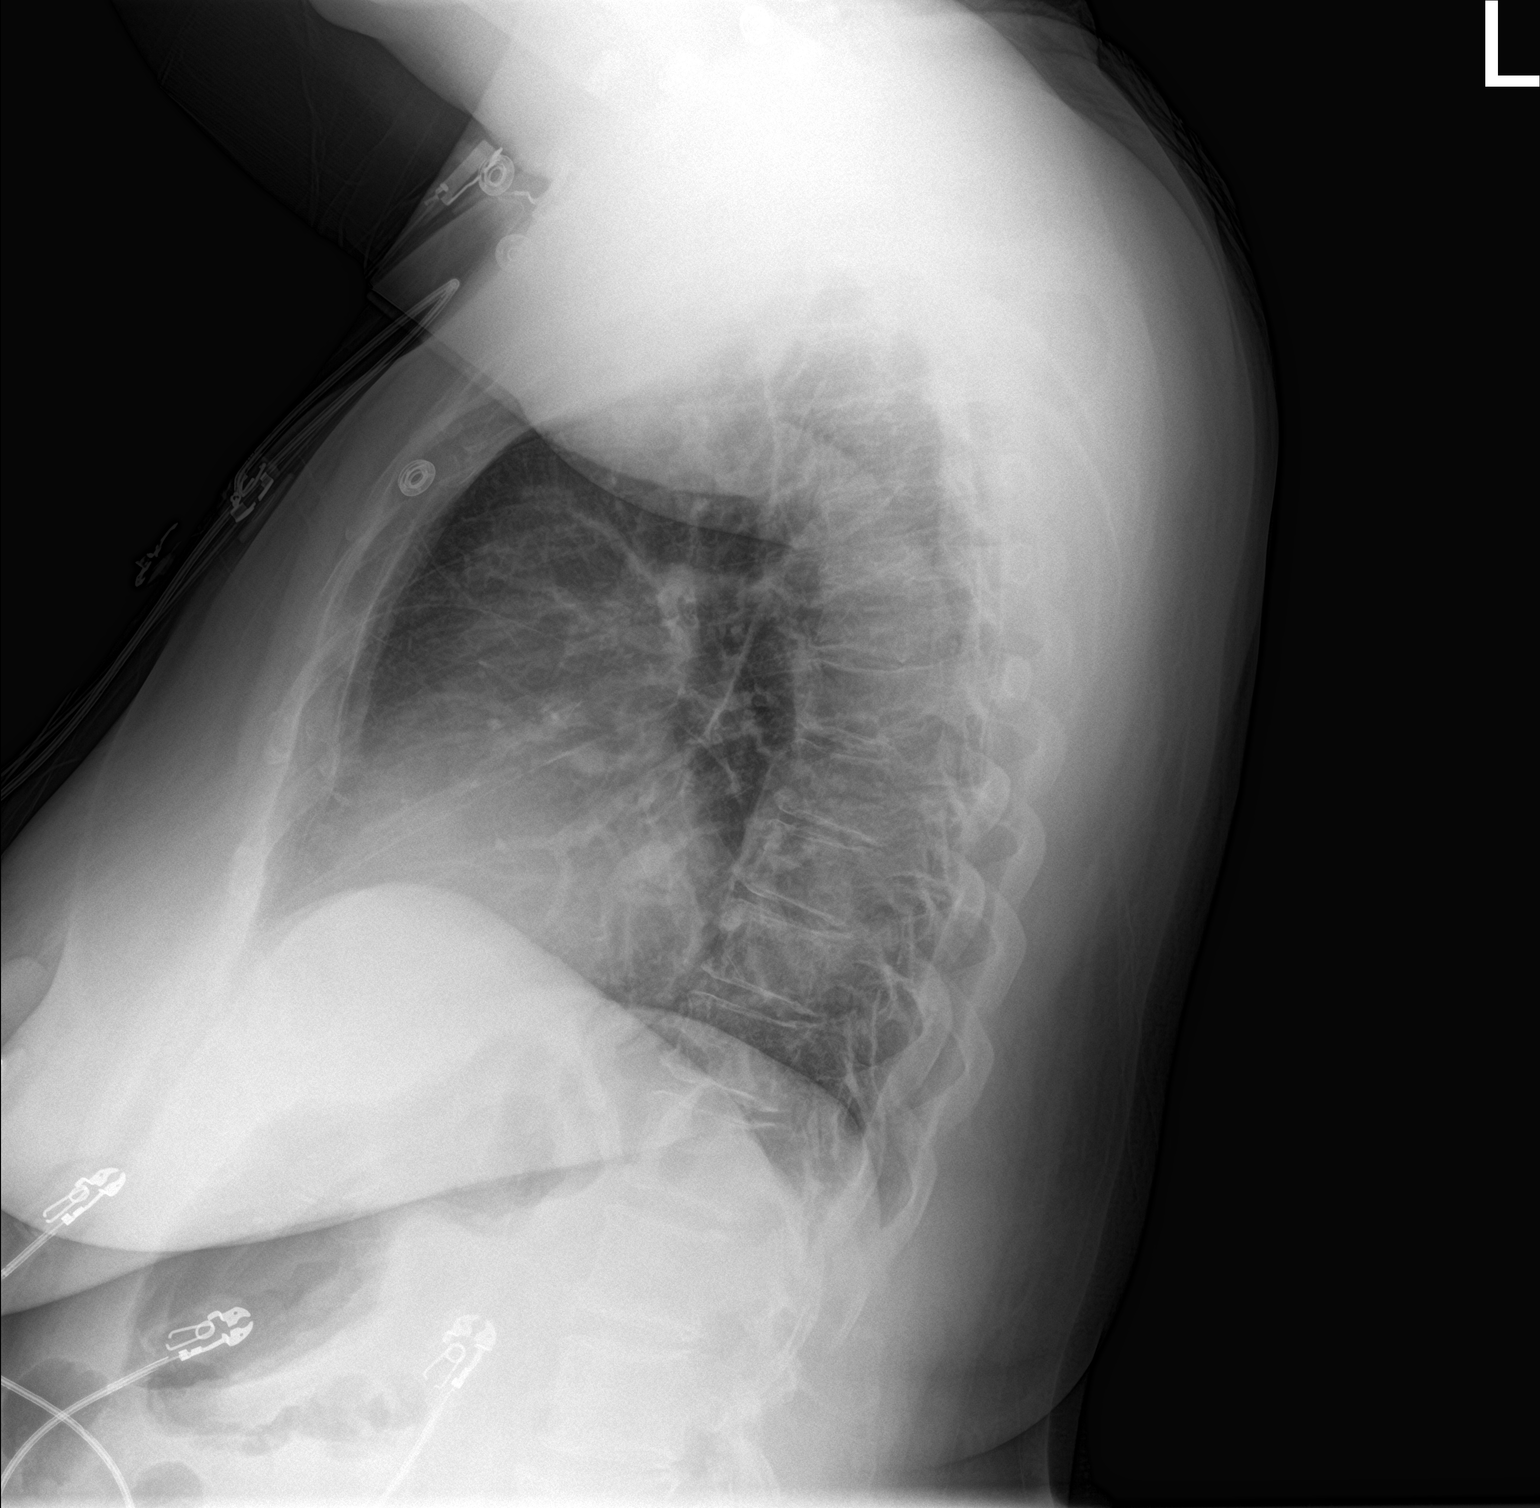

[2 of 2 positions shown; findings below may reference images not displayed]

FINDINGS: Lungs clear. Heart size upper normal. No pneumothorax or pleural
fluid. No acute or focal bony abnormality.
IMPRESSION: Negative chest.

## 2020-05-07 ENCOUNTER — Ambulatory Visit (INDEPENDENT_AMBULATORY_CARE_PROVIDER_SITE_OTHER): Payer: Medicare Other

## 2020-05-07 ENCOUNTER — Other Ambulatory Visit: Payer: Self-pay

## 2020-05-07 ENCOUNTER — Encounter: Payer: Self-pay | Admitting: Podiatry

## 2020-05-07 ENCOUNTER — Ambulatory Visit: Payer: Medicare Other | Admitting: Podiatry

## 2020-05-07 DIAGNOSIS — M722 Plantar fascial fibromatosis: Secondary | ICD-10-CM | POA: Diagnosis not present

## 2020-05-07 DIAGNOSIS — M21861 Other specified acquired deformities of right lower leg: Secondary | ICD-10-CM

## 2020-05-07 DIAGNOSIS — M216X1 Other acquired deformities of right foot: Secondary | ICD-10-CM | POA: Diagnosis not present

## 2020-05-07 DIAGNOSIS — Q667 Congenital pes cavus, unspecified foot: Secondary | ICD-10-CM | POA: Diagnosis not present

## 2020-05-07 DIAGNOSIS — M62461 Contracture of muscle, right lower leg: Secondary | ICD-10-CM

## 2020-05-07 MED ORDER — MELOXICAM 15 MG PO TABS: 15.0000 mg | ORAL_TABLET | Freq: Every day | ORAL | 3 refills | Status: DC

## 2020-05-07 NOTE — Progress Notes (Signed)
  Subjective:  Patient ID: Alexandria Walsh, female    DOB: 1950/11/22,  MRN: [id number]  Chief Complaint  Patient presents with  . Foot Pain    Right heel pain     70 y.o. female presents with the above complaint. History confirmed with patient.  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  Right Foot: point tenderness over the heel pad, she has gastrocnemius equinus  Radiographs: X-ray of : no fracture, dislocation, swelling or degenerative changes noted and plantar calcaneal spur, pes cavus foot type Assessment:   1. Plantar fasciitis, right   2. Pes cavus   3. Gastrocnemius equinus of right lower extremity      Plan:  Patient was evaluated and treated and all questions answered.  Discussed the etiology and treatment options for plantar fasciitis including stretching, formal physical therapy, supportive shoegears such as a running shoe or sneaker, pre fabricated orthoses, injection therapy, and oral medications. We also discussed the role of surgical treatment of this for patients who do not improve after exhausting non-surgical treatment options.   Plantar Fasciitis -XR reviewed with patient -Educated patient on stretching and icing of the affected limb -Plantar fascial brace dispensed -Rx for meloxicam. Educated on use, risks and benefits of the medication   After sterile prep with povidone-iodine solution and alcohol, the right heel was injected with 0.5cc 2% xylocaine plain, 0.5cc 0.5% marcaine plain, 5mg  triamcinolone acetonide, and 2mg  dexamethasone was injected along the plantar fascia at the insertion on the plantar calcaneus. The patient tolerated the procedure well without complication.  Discussed that her foot type relates to this and how it contributes to it.  We  discussed different types of shoes that would be helpful for this.  Return in about 1 month (around 06/07/2020) for recheck plantar fasciitis.

## 2020-05-07 NOTE — Patient Instructions (Signed)
Look for "motion control" or "stability" or "supinator" type shoes for high arches     Plantar Fasciitis (Heel Spur Syndrome) with Rehab The plantar fascia is a fibrous, ligament-like, soft-tissue structure that spans the bottom of the foot. Plantar fasciitis is a condition that causes pain in the foot due to inflammation of the tissue. SYMPTOMS   Pain and tenderness on the underneath side of the foot.  Pain that worsens with standing or walking. CAUSES  Plantar fasciitis is caused by irritation and injury to the plantar fascia on the underneath side of the foot. Common mechanisms of injury include:  Direct trauma to bottom of the foot.  Damage to a small nerve that runs under the foot where the main fascia attaches to the heel bone.  Stress placed on the plantar fascia due to bone spurs. RISK INCREASES WITH:   Activities that place stress on the plantar fascia (running, jumping, pivoting, or cutting).  Poor strength and flexibility.  Improperly fitted shoes.  Tight calf muscles.  Flat feet.  Failure to warm-up properly before activity.  Obesity. PREVENTION  Warm up and stretch properly before activity.  Allow for adequate recovery between workouts.  Maintain physical fitness:  Strength, flexibility, and endurance.  Cardiovascular fitness.  Maintain a health body weight.  Avoid stress on the plantar fascia.  Wear properly fitted shoes, including arch supports for individuals who have flat feet.  PROGNOSIS  If treated properly, then the symptoms of plantar fasciitis usually resolve without surgery. However, occasionally surgery is necessary.  RELATED COMPLICATIONS   Recurrent symptoms that may result in a chronic condition.  Problems of the lower back that are caused by compensating for the injury, such as limping.  Pain or weakness of the foot during push-off following surgery.  Chronic inflammation, scarring, and partial or complete fascia tear,  occurring more often from repeated injections.  TREATMENT  Treatment initially involves the use of ice and medication to help reduce pain and inflammation. The use of strengthening and stretching exercises may help reduce pain with activity, especially stretches of the Achilles tendon. These exercises may be performed at home or with a therapist. Your caregiver may recommend that you use heel cups of arch supports to help reduce stress on the plantar fascia. Occasionally, corticosteroid injections are given to reduce inflammation. If symptoms persist for greater than 6 months despite non-surgical (conservative), then surgery may be recommended.   MEDICATION   If pain medication is necessary, then nonsteroidal anti-inflammatory medications, such as aspirin and ibuprofen, or other minor pain relievers, such as acetaminophen, are often recommended.  Do not take pain medication within 7 days before surgery.  Prescription pain relievers may be given if deemed necessary by your caregiver. Use only as directed and only as much as you need.  Corticosteroid injections may be given by your caregiver. These injections should be reserved for the most serious cases, because they may only be given a certain number of times.  HEAT AND COLD  Cold treatment (icing) relieves pain and reduces inflammation. Cold treatment should be applied for 10 to 15 minutes every 2 to 3 hours for inflammation and pain and immediately after any activity that aggravates your symptoms. Use ice packs or massage the area with a piece of ice (ice massage).  Heat treatment may be used prior to performing the stretching and strengthening activities prescribed by your caregiver, physical therapist, or athletic trainer. Use a heat pack or soak the injury in warm water.  SEEK IMMEDIATE  MEDICAL CARE IF:  Treatment seems to offer no benefit, or the condition worsens.  Any medications produce adverse side effects.  EXERCISES- RANGE OF  MOTION (ROM) AND STRETCHING EXERCISES - Plantar Fasciitis (Heel Spur Syndrome) These exercises may help you when beginning to rehabilitate your injury. Your symptoms may resolve with or without further involvement from your physician, physical therapist or athletic trainer. While completing these exercises, remember:   Restoring tissue flexibility helps normal motion to return to the joints. This allows healthier, less painful movement and activity.  An effective stretch should be held for at least 30 seconds.  A stretch should never be painful. You should only feel a gentle lengthening or release in the stretched tissue.  RANGE OF MOTION - Toe Extension, Flexion  Sit with your right / left leg crossed over your opposite knee.  Grasp your toes and gently pull them back toward the top of your foot. You should feel a stretch on the bottom of your toes and/or foot.  Hold this stretch for 10 seconds.  Now, gently pull your toes toward the bottom of your foot. You should feel a stretch on the top of your toes and or foot.  Hold this stretch for 10 seconds. Repeat  times. Complete this stretch 3 times per day.   RANGE OF MOTION - Ankle Dorsiflexion, Active Assisted  Remove shoes and sit on a chair that is preferably not on a carpeted surface.  Place right / left foot under knee. Extend your opposite leg for support.  Keeping your heel down, slide your right / left foot back toward the chair until you feel a stretch at your ankle or calf. If you do not feel a stretch, slide your bottom forward to the edge of the chair, while still keeping your heel down.  Hold this stretch for 10 seconds. Repeat 3 times. Complete this stretch 2 times per day.   STRETCH  Gastroc, Standing  Place hands on wall.  Extend right / left leg, keeping the front knee somewhat bent.  Slightly point your toes inward on your back foot.  Keeping your right / left heel on the floor and your knee straight, shift  your weight toward the wall, not allowing your back to arch.  You should feel a gentle stretch in the right / left calf. Hold this position for 10 seconds. Repeat 3 times. Complete this stretch 2 times per day.  STRETCH  Soleus, Standing  Place hands on wall.  Extend right / left leg, keeping the other knee somewhat bent.  Slightly point your toes inward on your back foot.  Keep your right / left heel on the floor, bend your back knee, and slightly shift your weight over the back leg so that you feel a gentle stretch deep in your back calf.  Hold this position for 10 seconds. Repeat 3 times. Complete this stretch 2 times per day.  STRETCH  Gastrocsoleus, Standing  Note: This exercise can place a lot of stress on your foot and ankle. Please complete this exercise only if specifically instructed by your caregiver.   Place the ball of your right / left foot on a step, keeping your other foot firmly on the same step.  Hold on to the wall or a rail for balance.  Slowly lift your other foot, allowing your body weight to press your heel down over the edge of the step.  You should feel a stretch in your right / left calf.  Hold  this position for 10 seconds.  Repeat this exercise with a slight bend in your right / left knee. Repeat 3 times. Complete this stretch 2 times per day.   STRENGTHENING EXERCISES - Plantar Fasciitis (Heel Spur Syndrome)  These exercises may help you when beginning to rehabilitate your injury. They may resolve your symptoms with or without further involvement from your physician, physical therapist or athletic trainer. While completing these exercises, remember:   Muscles can gain both the endurance and the strength needed for everyday activities through controlled exercises.  Complete these exercises as instructed by your physician, physical therapist or athletic trainer. Progress the resistance and repetitions only as guided.  STRENGTH - Towel Curls  Sit in  a chair positioned on a non-carpeted surface.  Place your foot on a towel, keeping your heel on the floor.  Pull the towel toward your heel by only curling your toes. Keep your heel on the floor. Repeat 3 times. Complete this exercise 2 times per day.  STRENGTH - Ankle Inversion  Secure one end of a rubber exercise band/tubing to a fixed object (table, pole). Loop the other end around your foot just before your toes.  Place your fists between your knees. This will focus your strengthening at your ankle.  Slowly, pull your big toe up and in, making sure the band/tubing is positioned to resist the entire motion.  Hold this position for 10 seconds.  Have your muscles resist the band/tubing as it slowly pulls your foot back to the starting position. Repeat 3 times. Complete this exercises 2 times per day.  Document Released: 01/26/2005 Document Revised: 04/20/2011 Document Reviewed: 05/10/2008 [facility] Patient Information 2014 [place], [place].

## 2020-06-11 ENCOUNTER — Encounter: Payer: Self-pay | Admitting: Podiatry

## 2020-06-11 ENCOUNTER — Ambulatory Visit: Payer: Medicare Other | Admitting: Podiatry

## 2020-06-11 ENCOUNTER — Telehealth: Payer: Self-pay

## 2020-06-11 ENCOUNTER — Other Ambulatory Visit: Payer: Self-pay

## 2020-06-11 DIAGNOSIS — M216X1 Other acquired deformities of right foot: Secondary | ICD-10-CM

## 2020-06-11 DIAGNOSIS — M722 Plantar fascial fibromatosis: Secondary | ICD-10-CM | POA: Diagnosis not present

## 2020-06-11 DIAGNOSIS — M7661 Achilles tendinitis, right leg: Secondary | ICD-10-CM | POA: Diagnosis not present

## 2020-06-11 DIAGNOSIS — M21861 Other specified acquired deformities of right lower leg: Secondary | ICD-10-CM

## 2020-06-11 MED ORDER — NABUMETONE 500 MG PO TABS: 500.0000 mg | ORAL_TABLET | Freq: Two times a day (BID) | ORAL | 1 refills | Status: AC | PRN

## 2020-06-11 NOTE — Progress Notes (Signed)
  Subjective:  Patient ID: Alexandria Walsh, female    DOB: 1950/08/05,  MRN: [id number]  Chief Complaint  Patient presents with  . Plantar Fasciitis    4 week follow up right foot    70 y.o. female returns with the above complaint. History confirmed with patient.  Overall has had some improvement.  Injection has helped.  The pain is mostly in the plantar posterior portion of the heel and is really painful when she steps out of bed and feels really tight in the calf which she feels like it is cramping  Objective:  Physical Exam: warm, good capillary refill, no trophic changes or ulcerative lesions, normal DP and PT pulses and normal sensory exam.  Right Foot:Today less  point tenderness over the heel pad, she has gastrocnemius equinus  Radiographs: X-ray of : no fracture, dislocation, swelling or degenerative changes noted and plantar calcaneal spur, pes cavus foot type Assessment:   1. Plantar fasciitis, right   2. Gastrocnemius equinus of right lower extremity   3. Achilles tendinitis of right lower extremity      Plan:  Patient was evaluated and treated and all questions answered.  Discussed the etiology and treatment options for plantar fasciitis including stretching, formal physical therapy, supportive shoegears such as a running shoe or sneaker, pre fabricated orthoses, injection therapy, and oral medications. We also discussed the role of surgical treatment of this for patients who do not improve after exhausting non-surgical treatment options.   Plantar Fasciitis -XR reviewed with patient -Continue stretching and icing of the affected limb -Continue wearing plantar fascial brace  -Think should benefit from a night splint which was dispensed today as well -Continue meloxicam as needed -[facility]erral [facility] to [facility] for equinus and dry needling for tendinitis and muscle spasms  Return in about 6 weeks (around 07/23/2020) for recheck plantar fasciitis.

## 2020-06-11 NOTE — Telephone Encounter (Signed)
Orders for PT and patient's demographics faxed to [facility]  Evaluate and treat M76.61, M72.2, M21.6 x 1

## 2020-07-10 ENCOUNTER — Telehealth: Payer: Self-pay | Admitting: Podiatry

## 2020-07-10 NOTE — Telephone Encounter (Signed)
Pt wanted to know if she could be referred to get in home physical therapy since she would have to pay $60.00 a week for [facility].

## 2020-07-10 NOTE — Addendum Note (Signed)
Addended bySherryle Lis, Trinka Keshishyan R on: 07/10/2020 08:37 PM   Modules accepted: Orders

## 2020-08-05 ENCOUNTER — Other Ambulatory Visit (HOSPITAL_COMMUNITY): Payer: Self-pay | Admitting: Endocrinology

## 2020-08-05 DIAGNOSIS — M79604 Pain in right leg: Secondary | ICD-10-CM

## 2020-08-05 DIAGNOSIS — M79605 Pain in left leg: Secondary | ICD-10-CM

## 2020-08-06 ENCOUNTER — Ambulatory Visit (HOSPITAL_COMMUNITY)
Admission: RE | Admit: 2020-08-06 | Discharge: 2020-08-06 | Disposition: A | Discharge status: Home / Self Care | Payer: Medicare Other | Source: Ambulatory Visit | Attending: Endocrinology | Admitting: Endocrinology

## 2020-08-06 ENCOUNTER — Other Ambulatory Visit: Payer: Self-pay

## 2020-08-06 DIAGNOSIS — M79605 Pain in left leg: Secondary | ICD-10-CM | POA: Diagnosis not present

## 2020-08-06 DIAGNOSIS — M79604 Pain in right leg: Secondary | ICD-10-CM

## 2020-08-15 ENCOUNTER — Encounter: Payer: Self-pay | Admitting: Vascular Surgery

## 2020-08-15 ENCOUNTER — Other Ambulatory Visit: Payer: Self-pay

## 2020-08-15 ENCOUNTER — Ambulatory Visit: Payer: Medicare Other | Admitting: Vascular Surgery

## 2020-08-15 VITALS — BP 130/72 | HR 76 | Temp 97.9°F | Resp 20 | Ht 62.0 in | Wt 204.8 lb

## 2020-08-15 DIAGNOSIS — I83893 Varicose veins of bilateral lower extremities with other complications: Secondary | ICD-10-CM | POA: Diagnosis not present

## 2020-08-15 DIAGNOSIS — I8393 Asymptomatic varicose veins of bilateral lower extremities: Secondary | ICD-10-CM

## 2020-08-15 NOTE — Progress Notes (Signed)
Referring Physician: Dr. Reynold Bowen  Patient name: Alexandria Walsh MRN: [id number] DOB: 30-Mar-1950 Sex: female  REASON FOR CONSULT: Leg pain and swelling  HPI: Alexandria Walsh is a 70 y.o. female, with several month history of swelling in the lower extremities.  She also had an episode about 3 weeks ago where she had cramping in her right calf at nighttime.  She does not really describe claudication symptoms.  She states she has been placed on furosemide in the past for leg swelling but this did not completely alleviate all of her swelling symptoms.  She has worn some light weight compression stockings in the past.  She also complains of tingling in her toes bilaterally.  This occurs intermittently.  She also has some occasional tingling in her right hand up to the elbow.  She does have a history of diabetes over the last couple of years.  He also has hypertension.  She is a former smoker and quit 3 years ago.  She does not really describe any symptoms of TIA amaurosis or stroke.  Past Medical History:  Diagnosis Date   Allergy    Anemia    Diabetes mellitus without complication ([place])    Hypertension    Thyroid disease    Past Surgical History:  Procedure Laterality Date   [place]   COLONOSCOPY  at age 89   in [facility] [place]-normal exam per pt.    MYOMECTOMY  1987    Family History  Problem Relation Age of Onset   Hypertension Mother    Hypertension Father    Heart disease Other    Diabetes Maternal Aunt    Heart disease Maternal Uncle    Colon cancer Neg Hx    Colon polyps Neg Hx    Esophageal cancer Neg Hx    Rectal cancer Neg Hx    Stomach cancer Neg Hx     SOCIAL HISTORY: Social History   Socioeconomic History   Marital status: Widowed    Spouse name: Not on file   Number of children: Not on file   Years of education: Not on file   Highest education level: Not on file  Occupational History   Not on file  Tobacco Use   Smoking status:  Former    Pack years: 0.00    Types: Cigarettes    Quit date: 09/18/2017    Years since quitting: 2.9   Smokeless tobacco: Never  Vaping Use   Vaping Use: Never used  Substance and Sexual Activity   Alcohol use: Yes    Alcohol/week: 0.0 standard drinks    Comment: occas   Drug use: No   Sexual activity: Not on file  Other Topics Concern   Not on file  Social History Narrative   Not on file   Social Determinants of Health   Financial Resource Strain: Not on file  Food Insecurity: Not on file  Transportation Needs: Not on file  Physical Activity: Not on file  Stress: Not on file  Social Connections: Not on file  Intimate Partner Violence: Not on file    No Known Allergies  Current Outpatient Medications  Medication Sig Dispense Refill   albuterol (PROVENTIL HFA;VENTOLIN HFA) 108 (90 Base) MCG/ACT inhaler Inhale 1-2 puffs into the lungs every 6 (six) hours as needed for wheezing or shortness of breath. 1 Inhaler 0   albuterol (PROVENTIL) (2.5 MG/3ML) 0.083% nebulizer solution Inhale into the lungs.     gabapentin (NEURONTIN) 300 MG  capsule Take 300 mg by mouth as needed.     hydrochlorothiazide (MICROZIDE) 12.5 MG capsule Take 12.5 mg by mouth daily.     ipratropium (ATROVENT) 0.03 % nasal spray Place 2 sprays into both nostrils as needed for rhinitis.      levothyroxine (SYNTHROID) 50 MCG tablet Take 50 mcg by mouth daily.     metFORMIN (GLUCOPHAGE) 500 MG tablet Take 1 tablet (500 mg total) by mouth daily with breakfast. 90 tablet 3   metFORMIN (GLUCOPHAGE) 500 MG tablet Take by mouth.     montelukast (SINGULAIR) 10 MG tablet Take 10 mg by mouth daily.      omeprazole (PRILOSEC) 40 MG capsule Take 40 mg by mouth as needed.     potassium chloride (K-DUR) 10 MEQ tablet Take 10 mEq by mouth daily.     furosemide (LASIX) 20 MG tablet Take 1 tablet (20 mg total) by mouth daily for 4 days. 4 tablet 0   No current facility-administered medications for this visit.    ROS:    General:  No weight loss, Fever, chills  HEENT: No recent headaches, no nasal bleeding, no visual changes, no sore throat  Neurologic: No dizziness, blackouts, seizures. No recent symptoms of stroke or mini- stroke. No recent episodes of slurred speech, or temporary blindness.  Cardiac: No recent episodes of chest pain/pressure, no shortness of breath at rest.  No shortness of breath with exertion.  Denies history of atrial fibrillation or irregular heartbeat  Vascular: No history of rest pain in feet.  No history of claudication.  No history of non-healing ulcer, No history of DVT   Pulmonary: No home oxygen, no productive cough, no hemoptysis,  No asthma or wheezing  Musculoskeletal:  [ ]  Arthritis, [ ]  Low back pain,  [ ]  Joint pain  Hematologic:No history of hypercoagulable state.  No history of easy bleeding.  No history of anemia  Gastrointestinal: No hematochezia or melena,  No gastroesophageal reflux, no trouble swallowing  Urinary: [ ]  chronic Kidney disease, [ ]  on HD - [ ]  MWF or [ ]  TTHS, [ ]  Burning with urination, [ ]  Frequent urination, [ ]  Difficulty urinating;   Skin: No rashes  Psychological: No history of anxiety,  No history of depression   Physical Examination  Vitals:   08/15/20 1248  BP: 130/72  Pulse: 76  Resp: 20  Temp: 97.9 F (36.6 C)  SpO2: 95%  Weight: 204 lb 12.8 oz (92.9 kg)  Height: 5\' 2"  (1.575 m)    Body mass index is 37.46 kg/m.  General:  Alert and oriented, no acute distress HEENT: Normal Neck: No JVD Cardiac: Regular Rate and Rhythm Abdomen: Soft, non-tender, non-distended, obese Skin: No rash, scattered reticular type varicosities primarily in the thighs bilaterally.  He does have some varicose veins in her calves as well. Extremity Pulses:  2+ radial, brachial, femoral, 2+ right 1+ left dorsalis pedis, 1+ right 2+ left posterior tibial pulses bilaterally Musculoskeletal: No deformity or edema  Neurologic: Upper and lower  extremity motor 5/5 and symmetric  DATA:  Patient had bilateral ABIs performed on August 06, 2020.  I reviewed the results of those today.  ABI was greater than 1 triphasic bilaterally.  This is normal.  Toe pressure on the right was 128.  Toe pressure on the left was 81.  Toe pressure on the left is only slightly decreased from normal.  ASSESSMENT: 1.  Possible mild small vessel disease in the left foot although I do  not believe this explains her symptoms.  She has palpable pulses in both feet and certainly is not at risk of limb loss currently.  She was counseled on medical management of her diabetes to prevent worsening of this over time and could to need to refrain from smoking.  2.  Leg swelling probably multifactorial.  We discussed some weight loss to improve her leg swelling symptoms.  Some of this is probably venous hypertension.  She may also have a component of venous reflux.  In light of this we will place her in 20 to 30 mm knee-high compression stockings today and she will return in 3 months time for venous reflux exam.  3.  Intermittent numbness and tingling in her right arm.  This did not sound like symptoms of TIA and probably is more musculoskeletal in nature.  However, to reassure the patient we will obtain a carotid duplex scan at her next office visit in 3 months.  If she has continued symptoms I told her to call us back and we would consider moving that up.   PLAN: See above   Ruta Hinds, MD [facility] Office: [phone]

## 2020-08-16 ENCOUNTER — Other Ambulatory Visit: Payer: Self-pay

## 2020-08-16 DIAGNOSIS — R200 Anesthesia of skin: Secondary | ICD-10-CM

## 2020-08-16 DIAGNOSIS — I83893 Varicose veins of bilateral lower extremities with other complications: Secondary | ICD-10-CM

## 2020-08-16 DIAGNOSIS — R202 Paresthesia of skin: Secondary | ICD-10-CM

## 2020-10-07 ENCOUNTER — Other Ambulatory Visit: Payer: Self-pay | Admitting: Otolaryngology

## 2020-10-07 DIAGNOSIS — K115 Sialolithiasis: Secondary | ICD-10-CM

## 2020-11-06 ENCOUNTER — Ambulatory Visit
Admission: RE | Admit: 2020-11-06 | Discharge: 2020-11-06 | Disposition: A | Discharge status: Home / Self Care | Payer: Medicare Other | Source: Ambulatory Visit | Attending: Otolaryngology | Admitting: Otolaryngology

## 2020-11-06 ENCOUNTER — Other Ambulatory Visit: Payer: Self-pay

## 2020-11-06 DIAGNOSIS — K115 Sialolithiasis: Secondary | ICD-10-CM

## 2020-11-06 MED ORDER — IOPAMIDOL (ISOVUE-300) INJECTION 61%
75.0000 mL | Freq: Once | INTRAVENOUS | Status: AC | PRN
  Administered 2020-11-06: 75 mL via INTRAVENOUS

## 2020-11-12 ENCOUNTER — Ambulatory Visit: Payer: Medicare Other

## 2020-11-12 ENCOUNTER — Encounter (HOSPITAL_COMMUNITY): Payer: Medicare Other

## 2021-05-06 ENCOUNTER — Other Ambulatory Visit: Payer: Self-pay | Admitting: Otolaryngology

## 2021-05-06 DIAGNOSIS — B999 Unspecified infectious disease: Secondary | ICD-10-CM

## 2021-05-06 DIAGNOSIS — R221 Localized swelling, mass and lump, neck: Secondary | ICD-10-CM

## 2021-06-06 ENCOUNTER — Inpatient Hospital Stay: Admission: RE | Admit: 2021-06-06 | Payer: Medicare Other | Source: Ambulatory Visit

## 2021-06-27 ENCOUNTER — Ambulatory Visit
Admission: RE | Admit: 2021-06-27 | Discharge: 2021-06-27 | Disposition: A | Discharge status: Home / Self Care | Payer: Medicare Other | Source: Ambulatory Visit | Attending: Otolaryngology | Admitting: Otolaryngology

## 2021-06-27 DIAGNOSIS — R221 Localized swelling, mass and lump, neck: Secondary | ICD-10-CM

## 2021-06-27 DIAGNOSIS — B999 Unspecified infectious disease: Secondary | ICD-10-CM

## 2021-06-27 MED ORDER — IOPAMIDOL (ISOVUE-300) INJECTION 61%
75.0000 mL | Freq: Once | INTRAVENOUS | Status: AC | PRN
  Administered 2021-06-27: 75 mL via INTRAVENOUS

## 2021-08-27 ENCOUNTER — Other Ambulatory Visit (HOSPITAL_COMMUNITY): Payer: Medicare Other

## 2021-09-05 ENCOUNTER — Ambulatory Visit: Admit: 2021-09-05 | Payer: Medicare Other | Admitting: Orthopedic Surgery

## 2021-09-05 SURGERY — ARTHROPLASTY, KNEE, TOTAL
Anesthesia: Spinal | Site: Knee | Laterality: Right

## 2021-12-12 ENCOUNTER — Encounter (HOSPITAL_COMMUNITY): Payer: Self-pay

## 2021-12-12 ENCOUNTER — Ambulatory Visit (HOSPITAL_COMMUNITY)
Admission: EM | Admit: 2021-12-12 | Discharge: 2021-12-12 | Disposition: A | Discharge status: Home / Self Care | Payer: Medicare Other | Attending: Emergency Medicine | Admitting: Emergency Medicine

## 2021-12-12 DIAGNOSIS — J029 Acute pharyngitis, unspecified: Secondary | ICD-10-CM

## 2021-12-12 LAB — POCT RAPID STREP A, ED / UC: Streptococcus, Group A Screen (Direct): NEGATIVE

## 2021-12-12 NOTE — ED Triage Notes (Signed)
Pt is here for sore throat and tongue swelling x 1wk

## 2021-12-12 NOTE — ED Provider Notes (Signed)
[place]    CSN: [id number] Arrival date & time: 12/12/21  8299      History   Chief Complaint Chief Complaint  Patient presents with   Sore Throat    HPI Alexandria Walsh is a 71 y.o. female.  Presents with 1 week history of sore throat and sensation of tongue swelling Reports 6/10 pain worse today Denies trouble swallowing or breathing. No shortness of breath. No fevers  Uses humidifier and air purifier which help Tried honey/ginger with some relief Took a mucinex Doubled up on her reflux medicine  On chart review, patient had left submandibular surgery in 2021. Has been seen by otolaryngology multiple times since then reporting tongue swelling. Surgeon repeated CT 04/2020 without abnormal finding.  Patient called 1 week ago regarding continued symptoms and they prescribed magic mouthwash to try  Past Medical History:  Diagnosis Date   Allergy    Anemia    Diabetes mellitus without complication ([place])    Hypertension    Thyroid disease     Patient Active Problem List   Diagnosis Date Noted   Bleeding in mouth 12/14/2015   Chronic pain of both knees 12/14/2015   HTN (hypertension) 10/24/2013   Unspecified hypothyroidism 10/24/2013   Esophageal reflux 10/24/2013    Past Surgical History:  Procedure Laterality Date   [place]  at age 32   in [facility] [place]normal exam per pt.    MYOMECTOMY  1987    OB History   No obstetric history on file.      Home Medications    Prior to Admission medications   Medication Sig Start Date End Date Taking? Authorizing Provider  albuterol (PROVENTIL HFA;VENTOLIN HFA) 108 (90 Base) MCG/ACT inhaler Inhale 1-2 puffs into the lungs every 6 (six) hours as needed for wheezing or shortness of breath. 10/29/17   Bast, Tressia Miners A, NP  albuterol (PROVENTIL) (2.5 MG/3ML) 0.083% nebulizer solution Inhale into the lungs.    [provider]  furosemide (LASIX) 20 MG tablet Take 1  tablet (20 mg total) by mouth daily for 4 days. 06/15/18 06/19/18  Caccavale, Sophia, PA-C  gabapentin (NEURONTIN) 300 MG capsule Take 300 mg by mouth as needed. 04/27/18   [provider]  hydrochlorothiazide (MICROZIDE) 12.5 MG capsule Take 12.5 mg by mouth daily. 03/29/20   [provider]  ipratropium (ATROVENT) 0.03 % nasal spray Place 2 sprays into both nostrils as needed for rhinitis.  03/18/17   [provider]  levothyroxine (SYNTHROID) 50 MCG tablet Take 50 mcg by mouth daily. 03/25/20   [provider]  metFORMIN (GLUCOPHAGE) 500 MG tablet Take 1 tablet (500 mg total) by mouth daily with breakfast. 09/06/15   [name], PA-C  metFORMIN (GLUCOPHAGE) 500 MG tablet Take by mouth. 09/06/15   [provider]  montelukast (SINGULAIR) 10 MG tablet Take 10 mg by mouth daily.     [provider]  omeprazole (PRILOSEC) 40 MG capsule Take 40 mg by mouth as needed. 04/27/18   [provider]  potassium chloride (K-DUR) 10 MEQ tablet Take 10 mEq by mouth daily.    [provider]    Family History Family History  Problem Relation Age of Onset   Hypertension Mother    Hypertension Father    Heart disease Other    Diabetes Maternal Aunt    Heart disease Maternal Uncle    Colon cancer Neg Hx    Colon polyps Neg Hx  Esophageal cancer Neg Hx    Rectal cancer Neg Hx    Stomach cancer Neg Hx     Social History Social History   Tobacco Use   Smoking status: Former    Types: Cigarettes    Quit date: 09/18/2017    Years since quitting: 4.2   Smokeless tobacco: Never  Vaping Use   Vaping Use: Never used  Substance Use Topics   Alcohol use: Yes    Alcohol/week: 0.0 standard drinks of alcohol    Comment: occas   Drug use: No     Allergies   Patient has no known allergies.   Review of Systems Review of Systems  Per HPI  Physical Exam Triage Vital Signs ED Triage Vitals  Enc Vitals Group     BP 12/12/21 1039 (!)  161/83     Pulse --      Resp 12/12/21 1039 16     Temp 12/12/21 1039 98.3 F (36.8 C)     Temp Source 12/12/21 1039 Oral     SpO2 12/12/21 1039 98 %     Weight --      Height --      Head Circumference --      Peak Flow --      Pain Score 12/12/21 1037 6     Pain Loc --      Pain Edu? --      Excl. in [place]? --    No data found.  Updated Vital Signs BP (!) 161/83 (BP Location: Left Arm)   Temp 98.3 F (36.8 C) (Oral)   Resp 16   SpO2 98%    Physical Exam Vitals and nursing note reviewed.  Constitutional:      General: She is not in acute distress.    Appearance: Normal appearance. She is not ill-appearing.  HENT:     Head:     Jaw: There is normal jaw occlusion. No tenderness or swelling.     Nose: No congestion.     Mouth/Throat:     Mouth: Mucous membranes are moist. No oral lesions or angioedema.     Tongue: No lesions. Tongue does not deviate from midline.     Pharynx: Uvula midline. Posterior oropharyngeal erythema present.     Tonsils: No tonsillar exudate or tonsillar abscesses. 2+ on the right. 2+ on the left.     Comments: No tongue swelling noted. Normal tongue motion Cardiovascular:     Rate and Rhythm: Normal rate and regular rhythm.     Pulses: Normal pulses.     Heart sounds: Normal heart sounds.  Pulmonary:     Effort: Pulmonary effort is normal.     Breath sounds: Normal breath sounds.  Lymphadenopathy:     Cervical: Cervical adenopathy present.  Neurological:     Mental Status: She is alert and oriented to person, place, and time.      UC Treatments / Results  Labs (all labs ordered are listed, but only abnormal results are displayed) Labs Reviewed  POCT RAPID STREP A, ED / UC    EKG  Radiology No results found.  Procedures Procedures  Medications Ordered in UC Medications - No data to display  Initial Impression / Assessment and Plan / UC Course  I have reviewed the triage vital signs and the nursing notes.  Pertinent labs &  imaging results that were available during my care of the patient were reviewed by me and considered in my medical decision making (see chart  for details).  Offered tylenol for pain, patient declined as she has some at home Strep test obtained due to erythema and pain with swallowing Test negative.  Unknown etiology but discussed possibility of viral etiology, related to reflux, allergies, post-op complications persisting She has follow up with her ENT next week As of today well appearing, no acute distress, no signs of angioedema or tongue/mouth swelling, vitals stable  Recommend try tylenol at home for pain, continue fluids, propping up at night, reflux meds Using magic mouthwash as prescribed by ENT ED precautions discussed   Final Clinical Impressions(s) / UC Diagnoses   Final diagnoses:  Sore throat     Discharge Instructions      Your strep test was negative. I recommend trying tylenol at home, every 4 hours as needed for pain. Continue your reflux medication and humidifier/air purifier. Drink lots of fluids.  Follow up with your ENT at your visit next week. If at any point you have trouble swallowing or breathing, please go to the emergency department.      ED Prescriptions   None    PDMP not reviewed this encounter.   Aleiya Rye, Wells Guiles, PA-C 12/12/21 1145

## 2021-12-12 NOTE — Discharge Instructions (Addendum)
Your strep test was negative. I recommend trying tylenol at home, every 4 hours as needed for pain. Continue your reflux medication and humidifier/air purifier. Drink lots of fluids.  Follow up with your ENT at your visit next week. If at any point you have trouble swallowing or breathing, please go to the emergency department.

## 2022-02-18 DIAGNOSIS — E119 Type 2 diabetes mellitus without complications: Secondary | ICD-10-CM | POA: Diagnosis not present

## 2022-02-18 DIAGNOSIS — D23121 Other benign neoplasm of skin of left upper eyelid, including canthus: Secondary | ICD-10-CM | POA: Diagnosis not present

## 2022-02-18 DIAGNOSIS — H02055 Trichiasis without entropian left lower eyelid: Secondary | ICD-10-CM | POA: Diagnosis not present

## 2022-02-18 DIAGNOSIS — H2513 Age-related nuclear cataract, bilateral: Secondary | ICD-10-CM | POA: Diagnosis not present

## 2022-03-04 DIAGNOSIS — M8580 Other specified disorders of bone density and structure, unspecified site: Secondary | ICD-10-CM | POA: Diagnosis not present

## 2022-03-04 DIAGNOSIS — E785 Hyperlipidemia, unspecified: Secondary | ICD-10-CM | POA: Diagnosis not present

## 2022-03-04 DIAGNOSIS — I10 Essential (primary) hypertension: Secondary | ICD-10-CM | POA: Diagnosis not present

## 2022-03-04 DIAGNOSIS — R221 Localized swelling, mass and lump, neck: Secondary | ICD-10-CM | POA: Diagnosis not present

## 2022-03-04 DIAGNOSIS — I700 Atherosclerosis of aorta: Secondary | ICD-10-CM | POA: Diagnosis not present

## 2022-03-04 DIAGNOSIS — I517 Cardiomegaly: Secondary | ICD-10-CM | POA: Diagnosis not present

## 2022-03-04 DIAGNOSIS — E1142 Type 2 diabetes mellitus with diabetic polyneuropathy: Secondary | ICD-10-CM | POA: Diagnosis not present

## 2022-03-04 DIAGNOSIS — E039 Hypothyroidism, unspecified: Secondary | ICD-10-CM | POA: Diagnosis not present

## 2022-03-04 DIAGNOSIS — I739 Peripheral vascular disease, unspecified: Secondary | ICD-10-CM | POA: Diagnosis not present

## 2022-03-14 DIAGNOSIS — H524 Presbyopia: Secondary | ICD-10-CM | POA: Diagnosis not present

## 2022-03-14 DIAGNOSIS — H52209 Unspecified astigmatism, unspecified eye: Secondary | ICD-10-CM | POA: Diagnosis not present

## 2022-03-14 DIAGNOSIS — H5213 Myopia, bilateral: Secondary | ICD-10-CM | POA: Diagnosis not present

## 2022-05-05 DIAGNOSIS — Z1152 Encounter for screening for COVID-19: Secondary | ICD-10-CM | POA: Diagnosis not present

## 2022-05-05 DIAGNOSIS — J020 Streptococcal pharyngitis: Secondary | ICD-10-CM | POA: Diagnosis not present

## 2022-05-05 DIAGNOSIS — J029 Acute pharyngitis, unspecified: Secondary | ICD-10-CM | POA: Diagnosis not present

## 2022-05-05 DIAGNOSIS — R509 Fever, unspecified: Secondary | ICD-10-CM | POA: Diagnosis not present

## 2022-05-05 DIAGNOSIS — R0981 Nasal congestion: Secondary | ICD-10-CM | POA: Diagnosis not present

## 2022-05-05 DIAGNOSIS — I10 Essential (primary) hypertension: Secondary | ICD-10-CM | POA: Diagnosis not present

## 2022-05-05 DIAGNOSIS — R221 Localized swelling, mass and lump, neck: Secondary | ICD-10-CM | POA: Diagnosis not present

## 2022-05-05 DIAGNOSIS — B370 Candidal stomatitis: Secondary | ICD-10-CM | POA: Diagnosis not present

## 2022-06-18 DIAGNOSIS — R220 Localized swelling, mass and lump, head: Secondary | ICD-10-CM | POA: Diagnosis not present

## 2022-06-18 DIAGNOSIS — R221 Localized swelling, mass and lump, neck: Secondary | ICD-10-CM | POA: Diagnosis not present

## 2022-06-18 DIAGNOSIS — B370 Candidal stomatitis: Secondary | ICD-10-CM | POA: Diagnosis not present

## 2022-06-18 DIAGNOSIS — B3781 Candidal esophagitis: Secondary | ICD-10-CM | POA: Diagnosis not present

## 2022-06-30 DIAGNOSIS — E039 Hypothyroidism, unspecified: Secondary | ICD-10-CM | POA: Diagnosis not present

## 2022-06-30 DIAGNOSIS — I700 Atherosclerosis of aorta: Secondary | ICD-10-CM | POA: Diagnosis not present

## 2022-06-30 DIAGNOSIS — I10 Essential (primary) hypertension: Secondary | ICD-10-CM | POA: Diagnosis not present

## 2022-06-30 DIAGNOSIS — E669 Obesity, unspecified: Secondary | ICD-10-CM | POA: Diagnosis not present

## 2022-06-30 DIAGNOSIS — E1142 Type 2 diabetes mellitus with diabetic polyneuropathy: Secondary | ICD-10-CM | POA: Diagnosis not present

## 2022-06-30 DIAGNOSIS — J45998 Other asthma: Secondary | ICD-10-CM | POA: Diagnosis not present

## 2022-06-30 DIAGNOSIS — E785 Hyperlipidemia, unspecified: Secondary | ICD-10-CM | POA: Diagnosis not present

## 2022-06-30 DIAGNOSIS — I739 Peripheral vascular disease, unspecified: Secondary | ICD-10-CM | POA: Diagnosis not present

## 2022-06-30 DIAGNOSIS — E538 Deficiency of other specified B group vitamins: Secondary | ICD-10-CM | POA: Diagnosis not present

## 2022-06-30 DIAGNOSIS — D126 Benign neoplasm of colon, unspecified: Secondary | ICD-10-CM | POA: Diagnosis not present

## 2022-10-28 DIAGNOSIS — E538 Deficiency of other specified B group vitamins: Secondary | ICD-10-CM | POA: Diagnosis not present

## 2022-10-28 DIAGNOSIS — E039 Hypothyroidism, unspecified: Secondary | ICD-10-CM | POA: Diagnosis not present

## 2022-10-28 DIAGNOSIS — E785 Hyperlipidemia, unspecified: Secondary | ICD-10-CM | POA: Diagnosis not present

## 2022-10-28 DIAGNOSIS — E1142 Type 2 diabetes mellitus with diabetic polyneuropathy: Secondary | ICD-10-CM | POA: Diagnosis not present

## 2022-10-28 DIAGNOSIS — R221 Localized swelling, mass and lump, neck: Secondary | ICD-10-CM | POA: Diagnosis not present

## 2022-10-28 DIAGNOSIS — I119 Hypertensive heart disease without heart failure: Secondary | ICD-10-CM | POA: Diagnosis not present

## 2022-10-28 DIAGNOSIS — E669 Obesity, unspecified: Secondary | ICD-10-CM | POA: Diagnosis not present

## 2022-10-28 DIAGNOSIS — I700 Atherosclerosis of aorta: Secondary | ICD-10-CM | POA: Diagnosis not present

## 2022-12-04 DIAGNOSIS — M8580 Other specified disorders of bone density and structure, unspecified site: Secondary | ICD-10-CM | POA: Diagnosis not present

## 2022-12-04 DIAGNOSIS — E785 Hyperlipidemia, unspecified: Secondary | ICD-10-CM | POA: Diagnosis not present

## 2022-12-04 DIAGNOSIS — Z1389 Encounter for screening for other disorder: Secondary | ICD-10-CM | POA: Diagnosis not present

## 2022-12-04 DIAGNOSIS — E1142 Type 2 diabetes mellitus with diabetic polyneuropathy: Secondary | ICD-10-CM | POA: Diagnosis not present

## 2022-12-04 DIAGNOSIS — E039 Hypothyroidism, unspecified: Secondary | ICD-10-CM | POA: Diagnosis not present

## 2022-12-04 DIAGNOSIS — E538 Deficiency of other specified B group vitamins: Secondary | ICD-10-CM | POA: Diagnosis not present

## 2022-12-04 DIAGNOSIS — I10 Essential (primary) hypertension: Secondary | ICD-10-CM | POA: Diagnosis not present

## 2022-12-04 DIAGNOSIS — Z1212 Encounter for screening for malignant neoplasm of rectum: Secondary | ICD-10-CM | POA: Diagnosis not present

## 2022-12-08 DIAGNOSIS — E039 Hypothyroidism, unspecified: Secondary | ICD-10-CM | POA: Diagnosis not present

## 2022-12-08 DIAGNOSIS — E1142 Type 2 diabetes mellitus with diabetic polyneuropathy: Secondary | ICD-10-CM | POA: Diagnosis not present

## 2022-12-08 DIAGNOSIS — I119 Hypertensive heart disease without heart failure: Secondary | ICD-10-CM | POA: Diagnosis not present

## 2022-12-08 DIAGNOSIS — E669 Obesity, unspecified: Secondary | ICD-10-CM | POA: Diagnosis not present

## 2022-12-08 DIAGNOSIS — Z0000 Encounter for general adult medical examination without abnormal findings: Secondary | ICD-10-CM | POA: Diagnosis not present

## 2022-12-08 DIAGNOSIS — I700 Atherosclerosis of aorta: Secondary | ICD-10-CM | POA: Diagnosis not present

## 2022-12-08 DIAGNOSIS — E538 Deficiency of other specified B group vitamins: Secondary | ICD-10-CM | POA: Diagnosis not present

## 2022-12-08 DIAGNOSIS — I10 Essential (primary) hypertension: Secondary | ICD-10-CM | POA: Diagnosis not present

## 2022-12-08 DIAGNOSIS — Z1339 Encounter for screening examination for other mental health and behavioral disorders: Secondary | ICD-10-CM | POA: Diagnosis not present

## 2022-12-08 DIAGNOSIS — Z1331 Encounter for screening for depression: Secondary | ICD-10-CM | POA: Diagnosis not present

## 2022-12-08 DIAGNOSIS — E785 Hyperlipidemia, unspecified: Secondary | ICD-10-CM | POA: Diagnosis not present

## 2022-12-08 DIAGNOSIS — R82998 Other abnormal findings in urine: Secondary | ICD-10-CM | POA: Diagnosis not present

## 2022-12-08 DIAGNOSIS — Z23 Encounter for immunization: Secondary | ICD-10-CM | POA: Diagnosis not present

## 2022-12-08 DIAGNOSIS — R221 Localized swelling, mass and lump, neck: Secondary | ICD-10-CM | POA: Diagnosis not present

## 2022-12-11 DIAGNOSIS — H2513 Age-related nuclear cataract, bilateral: Secondary | ICD-10-CM | POA: Diagnosis not present

## 2022-12-11 DIAGNOSIS — E119 Type 2 diabetes mellitus without complications: Secondary | ICD-10-CM | POA: Diagnosis not present

## 2022-12-11 DIAGNOSIS — H04123 Dry eye syndrome of bilateral lacrimal glands: Secondary | ICD-10-CM | POA: Diagnosis not present

## 2022-12-11 DIAGNOSIS — H35033 Hypertensive retinopathy, bilateral: Secondary | ICD-10-CM | POA: Diagnosis not present

## 2023-02-13 ENCOUNTER — Encounter (HOSPITAL_COMMUNITY): Payer: Self-pay

## 2023-02-13 ENCOUNTER — Ambulatory Visit (HOSPITAL_COMMUNITY)
Admission: EM | Admit: 2023-02-13 | Discharge: 2023-02-13 | Disposition: A | Discharge status: Home / Self Care | Payer: HMO

## 2023-02-13 DIAGNOSIS — J209 Acute bronchitis, unspecified: Secondary | ICD-10-CM | POA: Diagnosis not present

## 2023-02-13 DIAGNOSIS — B370 Candidal stomatitis: Secondary | ICD-10-CM | POA: Diagnosis not present

## 2023-02-13 MED ORDER — BENZONATATE 100 MG PO CAPS: 100.0000 mg | ORAL_CAPSULE | Freq: Three times a day (TID) | ORAL | 0 refills | Status: DC | PRN

## 2023-02-13 MED ORDER — AMOXICILLIN-POT CLAVULANATE 875-125 MG PO TABS: 1.0000 | ORAL_TABLET | Freq: Two times a day (BID) | ORAL | 0 refills | Status: DC

## 2023-02-13 MED ORDER — NYSTATIN 100000 UNIT/ML MT SUSP: 500000.0000 [IU] | Freq: Four times a day (QID) | OROMUCOSAL | 0 refills | Status: AC

## 2023-02-13 NOTE — ED Provider Notes (Addendum)
 Alexandria Walsh   [id number] 02/13/23 Arrival Time: 1021   Chief Complaint  Patient presents with   Cough     SUBJECTIVE: History from: patient.  Alexandria Walsh is a 73 y.o. female who who presents to the urgent care for complaint of cough, nasal congestion, chills, body ache headache for the past 2 weeks.  Denies sick exposure to COVID, flu or strep.  She reports her symptom has worsening the past 2 days.  She reports she has used Mucinex  with little relief..  Denies previous symptoms in the past.   Denies fever,  fatigue, sinus pain, rhinorrhea, sore throat, SOB, chest pain, nausea, changes in bowel or bladder habits.     ROS: As per HPI.  All other pertinent ROS negative.      Past Medical History:  Diagnosis Date   Allergy    Anemia    Diabetes mellitus without complication (H[facility])    Hypertension    Thyroid  disease    Past Surgical History:  Procedure Laterality Date   CESAREAN SECTION  1988 1991   COLONOSCOPY  at age 24   in [facility] [place]-normal exam per pt.    MYOMECTOMY  1987   No Known Allergies No current facility-administered medications on file prior to encounter.   Current Outpatient Medications on File Prior to Encounter  Medication Sig Dispense Refill   rosuvastatin  (CRESTOR ) 5 MG tablet Take 5 mg by mouth 2 (two) times a week.     zolpidem  (AMBIEN ) 5 MG tablet Take 5 mg by mouth at bedtime as needed.     albuterol  (PROVENTIL  HFA;VENTOLIN  HFA) 108 (90 Base) MCG/ACT inhaler Inhale 1-2 puffs into the lungs every 6 (six) hours as needed for wheezing or shortness of breath. (Patient not taking: Reported on 02/13/2023) 1 Inhaler 0   albuterol  (PROVENTIL ) (2.5 MG/3ML) 0.083% nebulizer solution Inhale into the lungs.     furosemide  (LASIX ) 20 MG tablet Take 1 tablet (20 mg total) by mouth daily for 4 days. (Patient not taking: Reported on 02/13/2023) 4 tablet 0   hydrochlorothiazide  (MICROZIDE ) 12.5 MG capsule Take 12.5 mg by mouth daily.     ipratropium  (ATROVENT ) 0.03 % nasal spray Place 2 sprays into both nostrils as needed for rhinitis.      levothyroxine  (SYNTHROID ) 50 MCG tablet Take 50 mcg by mouth daily.     metFORMIN  (GLUCOPHAGE ) 500 MG tablet Take 1 tablet (500 mg total) by mouth daily with breakfast. 90 tablet 3   metFORMIN  (GLUCOPHAGE ) 500 MG tablet Take by mouth.     montelukast  (SINGULAIR ) 10 MG tablet Take 10 mg by mouth daily.      omeprazole  (PRILOSEC) 40 MG capsule Take 40 mg by mouth as needed.     potassium chloride  (K-DUR) 10 MEQ tablet Take 10 mEq by mouth daily.     Social History   Socioeconomic History   Marital status: Widowed    Spouse name: Not on file   Number of children: Not on file   Years of education: Not on file   Highest education level: Not on file  Occupational History   Not on file  Tobacco Use   Smoking status: Former    Current packs/day: 0.00    Types: Cigarettes    Quit date: 09/18/2017    Years since quitting: 5.4   Smokeless tobacco: Never  Vaping Use   Vaping status: Never Used  Substance and Sexual Activity   Alcohol  use: Yes    Alcohol /week: 0.0 standard drinks  of alcohol     Comment: occas   Drug use: No   Sexual activity: Not on file  Other Topics Concern   Not on file  Social History Narrative   Not on file   Social Drivers of Health   Financial Resource Strain: Low Risk  (08/23/2019)   Received from [facility], [facility]   Overall Financial Resource Strain (CARDIA)    Difficulty of Paying Living Expenses: Not hard at all  Food Insecurity: No Food Insecurity (08/23/2019)   [facility], [facility]   Hunger Vital Sign    Worried About Running Out of Food in the Last Year: Never true    Ran Out of Food in the Last Year: Never true  Transportation Needs: No Transportation Needs (08/23/2019)   Received from [facility], [facility]   [facility] - Transportation    Lack of Transportation (Medical): No    Lack of Transportation  (Non-Medical): No  Physical Activity: Not on file  Stress: Not on file  Social Connections: Unknown (06/23/2021)   Received from [facility], [facility]   Social Network    Social Network: Not on file  Intimate Partner Violence: Unknown (05/15/2021)   Received from [facility], [facility]   HITS    Physically Hurt: Not on file    Insult or Talk Down To: Not on file    Threaten Physical Harm: Not on file    Scream or Curse: Not on file   Family History  Problem Relation Age of Onset   Hypertension Mother    Hypertension Father    Heart disease Other    Diabetes Maternal Aunt    Heart disease Maternal Uncle    Colon cancer Neg Hx    Colon polyps Neg Hx    Esophageal cancer Neg Hx    Rectal cancer Neg Hx    Stomach cancer Neg Hx     OBJECTIVE:  Vitals:   02/13/23 1156  BP: 139/83  Pulse: 80  Resp: 16  Temp: 98.4 F (36.9 C)  TempSrc: Oral  SpO2: 99%  Weight: 195 lb (88.5 kg)  Height: 5' 3.5 (1.613 m)     Physical Exam Vitals and nursing note reviewed.  Constitutional:      General: She is not in acute distress.    Appearance: Normal appearance. She is normal weight. She is not ill-appearing, toxic-appearing or diaphoretic.  HENT:     Head: Normocephalic.     Right Ear: Tympanic membrane, ear canal and external ear normal. There is no impacted cerumen.     Left Ear: Tympanic membrane, ear canal and external ear normal. There is no impacted cerumen.     Nose: Congestion present.     Mouth/Throat:     Tonsils: 1+ on the right. 2+ on the left.  Cardiovascular:     Rate and Rhythm: Normal rate and regular rhythm.     Pulses: Normal pulses.     Heart sounds: Normal heart sounds. No murmur heard.    No friction rub. No gallop.  Pulmonary:     Effort: Pulmonary effort is normal. No respiratory distress.     Breath sounds: Normal breath sounds. No stridor. No wheezing, rhonchi or rales.  Chest:     Chest wall: No tenderness.  Neurological:     Mental  Status: She is alert and oriented to person, place, and time.      LABS:  No results found  for this or any previous visit (from the past 24 hours).   ASSESSMENT & PLAN:  1. Acute bronchitis, unspecified organism   2. Thrush     Meds ordered this encounter  Medications   amoxicillin -clavulanate (AUGMENTIN ) 875-125 MG tablet    Sig: Take 1 tablet by mouth every 12 (twelve) hours.    Dispense:  14 tablet    Refill:  0   benzonatate  (TESSALON ) 100 MG capsule    Sig: Take 1 capsule (100 mg total) by mouth 3 (three) times daily as needed for cough.    Dispense:  30 capsule    Refill:  0   nystatin  (MYCOSTATIN ) 100000 UNIT/ML suspension    Sig: Take 5 mLs (500,000 Units total) by mouth 4 (four) times daily for 7 days.    Dispense:  60 mL    Refill:  0   Nystatin  mouthwash was prescribed as patient stated that she may developed thrush while on the antibiotic.  Discharge instructions  Get plenty of rest and push fluids Tessalon  Perles prescribed for cough Augmentin  prescribed for bronchitis Nystatin  mouthwash was prescribed for thrush Use medications daily for symptom relief Use OTC medications like ibuprofen  or tylenol  as needed fever or pain Call or go to the ED if you have any new or worsening symptoms such as fever, worsening cough, shortness of breath, chest tightness, chest pain, turning blue, changes in mental status, etc...   Reviewed expectations re: course of current medical issues. Questions answered. Outlined signs and symptoms indicating need for more acute intervention. Patient verbalized understanding. After Visit Summary given.          Sakshi Sermons S, FNP 02/13/23 1220    Deondria Puryear S, FNP 02/13/23 1220

## 2023-02-13 NOTE — ED Triage Notes (Signed)
 Patient here today with c/o cough, nasal congestion, wheeze, chills, body aches, headache, and ST X 2 weeks. Symptoms worsened yesterday. She has been taking Mucinex with little relief. She took Tylenol this morning with no relief. No known sick contacts.

## 2023-02-13 NOTE — Discharge Instructions (Signed)
 Get plenty of rest and push fluids Tessalon  Perles prescribed for cough Augmentin  prescribed for bronchitis Nystatin  mouthwash was prescribed for thrush Use medications daily for symptom relief Use OTC medications like ibuprofen  or tylenol  as needed fever or pain Call or go to the ED if you have any new or worsening symptoms such as fever, worsening cough, shortness of breath, chest tightness, chest pain, turning blue, changes in mental status, etc..SABRA

## 2023-02-16 DIAGNOSIS — H2512 Age-related nuclear cataract, left eye: Secondary | ICD-10-CM | POA: Diagnosis not present

## 2023-03-05 DIAGNOSIS — J069 Acute upper respiratory infection, unspecified: Secondary | ICD-10-CM | POA: Diagnosis not present

## 2023-03-05 DIAGNOSIS — I10 Essential (primary) hypertension: Secondary | ICD-10-CM | POA: Diagnosis not present

## 2023-03-05 DIAGNOSIS — J45998 Other asthma: Secondary | ICD-10-CM | POA: Diagnosis not present

## 2023-03-05 DIAGNOSIS — E1142 Type 2 diabetes mellitus with diabetic polyneuropathy: Secondary | ICD-10-CM | POA: Diagnosis not present

## 2023-03-05 DIAGNOSIS — B370 Candidal stomatitis: Secondary | ICD-10-CM | POA: Diagnosis not present

## 2023-03-05 DIAGNOSIS — R051 Acute cough: Secondary | ICD-10-CM | POA: Diagnosis not present

## 2023-03-09 DIAGNOSIS — H2512 Age-related nuclear cataract, left eye: Secondary | ICD-10-CM | POA: Diagnosis not present

## 2023-03-15 DIAGNOSIS — H2511 Age-related nuclear cataract, right eye: Secondary | ICD-10-CM | POA: Diagnosis not present

## 2023-03-23 DIAGNOSIS — H25811 Combined forms of age-related cataract, right eye: Secondary | ICD-10-CM | POA: Diagnosis not present

## 2023-03-23 DIAGNOSIS — H2511 Age-related nuclear cataract, right eye: Secondary | ICD-10-CM | POA: Diagnosis not present

## 2023-03-25 ENCOUNTER — Other Ambulatory Visit: Payer: Self-pay

## 2023-03-25 ENCOUNTER — Ambulatory Visit (HOSPITAL_COMMUNITY)
Admission: EM | Admit: 2023-03-25 | Discharge: 2023-03-25 | Disposition: A | Discharge status: Home / Self Care | Payer: PPO | Attending: Emergency Medicine | Admitting: Emergency Medicine

## 2023-03-25 DIAGNOSIS — B379 Candidiasis, unspecified: Secondary | ICD-10-CM | POA: Diagnosis not present

## 2023-03-25 DIAGNOSIS — J209 Acute bronchitis, unspecified: Secondary | ICD-10-CM | POA: Diagnosis not present

## 2023-03-25 DIAGNOSIS — T3695XA Adverse effect of unspecified systemic antibiotic, initial encounter: Secondary | ICD-10-CM

## 2023-03-25 MED ORDER — FLUTICASONE PROPIONATE HFA 110 MCG/ACT IN AERO: 2.0000 | INHALATION_SPRAY | Freq: Two times a day (BID) | RESPIRATORY_TRACT | 0 refills | Status: DC

## 2023-03-25 MED ORDER — FLUCONAZOLE 150 MG PO TABS: ORAL_TABLET | ORAL | 0 refills | Status: AC

## 2023-03-25 MED ORDER — NYSTATIN 100000 UNIT/ML MT SUSP: 500000.0000 [IU] | Freq: Four times a day (QID) | OROMUCOSAL | 0 refills | Status: AC

## 2023-03-25 MED ORDER — PROMETHAZINE-DM 6.25-15 MG/5ML PO SYRP: 5.0000 mL | ORAL_SOLUTION | Freq: Every evening | ORAL | 0 refills | Status: AC | PRN

## 2023-03-25 MED ORDER — GUAIFENESIN 100 MG/5ML PO LIQD
400.0000 mg | Freq: Three times a day (TID) | ORAL | 0 refills | Status: AC | PRN
Start: 2023-03-25 — End: ?

## 2023-03-25 MED ORDER — ALBUTEROL SULFATE HFA 108 (90 BASE) MCG/ACT IN AERS: 2.0000 | INHALATION_SPRAY | Freq: Four times a day (QID) | RESPIRATORY_TRACT | 2 refills | Status: AC | PRN

## 2023-03-25 MED ORDER — AZITHROMYCIN 500 MG PO TABS: 500.0000 mg | ORAL_TABLET | Freq: Every day | ORAL | 0 refills | Status: AC

## 2023-03-25 NOTE — ED Provider Notes (Signed)
[facility]    CSN: [id number] Arrival date & time: 03/25/23  1638    HISTORY   Chief Complaint  Patient presents with   Cough   HPI Alexandria Walsh is a pleasant, 73 y.o. female who presents to urgent care today. Patient complains of cough and sore throat that began about 10 days ago.  Patient states her chest is sore with coughing has nasal congestion, rhinorrhea.  Patient states she is concerned that she may have thrush.  Patient states has been taking Mucinex.   Cough  Past Medical History:  Diagnosis Date   Allergy    Anemia    Diabetes mellitus without complication (HCC)    Hypertension    Thyroid disease    Patient Active Problem List   Diagnosis Date Noted   Bleeding in mouth 12/14/2015   Chronic pain of both knees 12/14/2015   HTN (hypertension) 10/24/2013   Hypothyroidism 10/24/2013   Esophageal reflux 10/24/2013   Past Surgical History:  Procedure Laterality Date   CESAREAN SECTION  1988 1991   COLONOSCOPY  at age 79   in [facility] [place]-normal exam per pt.    MYOMECTOMY  1987   OB History   No obstetric history on file.    Home Medications    Prior to Admission medications   Medication Sig Start Date End Date Taking? Authorizing Provider  albuterol (PROVENTIL HFA;VENTOLIN HFA) 108 (90 Base) MCG/ACT inhaler Inhale 1-2 puffs into the lungs every 6 (six) hours as needed for wheezing or shortness of breath. Patient not taking: Reported on 02/13/2023 10/29/17   Dahlia Byes A, FNP  albuterol (PROVENTIL) (2.5 MG/3ML) 0.083% nebulizer solution Inhale into the lungs.    [provider]  amoxicillin-clavulanate (AUGMENTIN) 875-125 MG tablet Take 1 tablet by mouth every 12 (twelve) hours. Patient not taking: Reported on 03/25/2023 02/13/23   Durward Parcel, FNP  benzonatate (TESSALON) 100 MG capsule Take 1 capsule (100 mg total) by mouth 3 (three) times daily as needed for cough. Patient not taking: Reported on 03/25/2023 02/13/23   Durward Parcel, FNP  furosemide (LASIX) 20 MG tablet Take 1 tablet (20 mg total) by mouth daily for 4 days. Patient not taking: Reported on 02/13/2023 06/15/18 06/19/18  Caccavale, Sophia, PA-C  hydrochlorothiazide (MICROZIDE) 12.5 MG capsule Take 12.5 mg by mouth daily. 03/29/20   [provider]  ipratropium (ATROVENT) 0.03 % nasal spray Place 2 sprays into both nostrils as needed for rhinitis.  03/18/17   [provider]  levothyroxine (SYNTHROID) 50 MCG tablet Take 50 mcg by mouth daily. 03/25/20   [provider]  metFORMIN (GLUCOPHAGE) 500 MG tablet Take 1 tablet (500 mg total) by mouth daily with breakfast. 09/06/15   Wallis Bamberg, PA-C  metFORMIN (GLUCOPHAGE) 500 MG tablet Take by mouth. 09/06/15   [provider]  montelukast (SINGULAIR) 10 MG tablet Take 10 mg by mouth daily.     [provider]  omeprazole (PRILOSEC) 40 MG capsule Take 40 mg by mouth as needed. 04/27/18   [provider]  potassium chloride (K-DUR) 10 MEQ tablet Take 10 mEq by mouth daily.    [provider]  rosuvastatin (CRESTOR) 5 MG tablet Take 5 mg by mouth 2 (two) times a week. 01/11/23   [provider]  zolpidem (AMBIEN) 5 MG tablet Take 5 mg by mouth at bedtime as needed. 01/11/23   [provider]    Family History Family History  Problem Relation Age of Onset  Hypertension Mother    Hypertension Father    Heart disease Other    Diabetes Maternal Aunt    Heart disease Maternal Uncle    Colon cancer Neg Hx    Colon polyps Neg Hx    Esophageal cancer Neg Hx    Rectal cancer Neg Hx    Stomach cancer Neg Hx    Social History Social History   Tobacco Use   Smoking status: Former    Current packs/day: 0.00    Types: Cigarettes    Quit date: 09/18/2017    Years since quitting: 5.5   Smokeless tobacco: Never  Vaping Use   Vaping status: Never Used  Substance Use Topics   Alcohol use: Yes    Alcohol/week: 0.0 standard drinks of  alcohol    Comment: occas   Drug use: No   Allergies   Ace inhibitors  Review of Systems Review of Systems  Respiratory:  Positive for cough.    Pertinent findings revealed after performing a 14 point review of systems has been noted in the history of present illness.  Physical Exam Vital Signs BP 106/76 (BP Location: Left Arm)   Pulse 89   Temp 99.3 F (37.4 C) (Oral)   Resp 18   SpO2 96%   No data found.  Physical Exam Vitals and nursing note reviewed.  Constitutional:      General: She is not in acute distress.    Appearance: Normal appearance. She is not ill-appearing.  HENT:     Head: Normocephalic and atraumatic.     Salivary Glands: Right salivary gland is not diffusely enlarged or tender. Left salivary gland is not diffusely enlarged or tender.     Right Ear: Tympanic membrane, ear canal and external ear normal. No drainage. No middle ear effusion. There is no impacted cerumen. Tympanic membrane is not erythematous or bulging.     Left Ear: Tympanic membrane, ear canal and external ear normal. No drainage.  No middle ear effusion. There is no impacted cerumen. Tympanic membrane is not erythematous or bulging.     Nose: Nose normal. No nasal deformity, septal deviation, mucosal edema, congestion or rhinorrhea.     Right Turbinates: Not enlarged, swollen or pale.     Left Turbinates: Not enlarged, swollen or pale.     Right Sinus: No maxillary sinus tenderness or frontal sinus tenderness.     Left Sinus: No maxillary sinus tenderness or frontal sinus tenderness.     Mouth/Throat:     Lips: Pink. No lesions.     Mouth: Mucous membranes are moist. No oral lesions.     Pharynx: Oropharynx is clear. Uvula midline. No posterior oropharyngeal erythema or uvula swelling.     Tonsils: No tonsillar exudate. 0 on the right. 0 on the left.  Eyes:     General: Lids are normal.        Right eye: No discharge.        Left eye: No discharge.     Extraocular Movements:  Extraocular movements intact.     Conjunctiva/sclera: Conjunctivae normal.     Right eye: Right conjunctiva is not injected.     Left eye: Left conjunctiva is not injected.  Neck:     Trachea: Trachea and phonation normal.  Cardiovascular:     Rate and Rhythm: Normal rate and regular rhythm.     Pulses: Normal pulses.     Heart sounds: Normal heart sounds. No murmur heard.    No friction rub.  No gallop.  Pulmonary:     Effort: Pulmonary effort is normal. No accessory muscle usage, prolonged expiration or respiratory distress.     Breath sounds: Normal breath sounds. No stridor, decreased air movement or transmitted upper airway sounds. No decreased breath sounds, wheezing, rhonchi or rales.  Chest:     Chest wall: No tenderness.  Musculoskeletal:        General: Normal range of motion.     Cervical back: Normal range of motion and neck supple. Normal range of motion.  Lymphadenopathy:     Cervical: No cervical adenopathy.  Skin:    General: Skin is warm and dry.     Findings: No erythema or rash.  Neurological:     General: No focal deficit present.     Mental Status: She is alert and oriented to person, place, and time.  Psychiatric:        Mood and Affect: Mood normal.        Behavior: Behavior normal.     Visual Acuity Right Eye Distance:   Left Eye Distance:   Bilateral Distance:    Right Eye Near:   Left Eye Near:    Bilateral Near:     UC Couse / Diagnostics / Procedures:     Radiology No results found.  Procedures Procedures (including critical care time) EKG  Pending results:  Labs Reviewed - No data to display  Medications Ordered in UC: Medications - No data to display  UC Diagnoses / Final Clinical Impressions(s)   I have reviewed the triage vital signs and the nursing notes.  Pertinent labs & imaging results that were available during my care of the patient were reviewed by me and considered in my medical decision making (see chart for  details).    Final diagnoses:  Acute bronchitis, unspecified organism   Patient advised physical exam findings concerning for bronchitis.  Patient provided with inhaled corticosteroid as well as inhaled beta agonist.  Azithromycin was added for prophylaxis of pneumonia given duration and severity of cough.  Patient requesting fluconazole tablets and nystatin mouthwash for inevitable antibiotic induced vaginal yeast infection and thrush, respectively.  Recommend guaifenesin to promote expectoration during the daytime and Promethazine DM for nighttime cough.  Conservative care recommended.  Return precautions advised.  Please see discharge instructions below for details of plan of care as provided to patient. ED Prescriptions     Medication Sig Dispense Auth. Provider   fluticasone (FLOVENT HFA) 110 MCG/ACT inhaler Inhale 2 puffs into the lungs every 12 (twelve) hours. 1 each Theadora Rama Scales, PA-C   albuterol (VENTOLIN HFA) 108 (90 Base) MCG/ACT inhaler Inhale 2 puffs into the lungs every 6 (six) hours as needed for wheezing or shortness of breath (Cough). 18 g Theadora Rama Scales, PA-C   azithromycin (ZITHROMAX) 500 MG tablet Take 1 tablet (500 mg total) by mouth daily for 3 days. 3 tablet Theadora Rama Scales, PA-C   fluconazole (DIFLUCAN) 150 MG tablet Take 1 tablet today.  Take second tablet 3 days later. 2 tablet Theadora Rama Scales, PA-C   promethazine-dextromethorphan (PROMETHAZINE-DM) 6.25-15 MG/5ML syrup Take 5 mLs by mouth at bedtime as needed for cough. 60 mL Theadora Rama Scales, PA-C   guaiFENesin (ROBITUSSIN) 100 MG/5ML liquid Take 20 mLs (400 mg total) by mouth every 8 (eight) hours as needed for cough or to loosen phlegm. 473 mL Theadora Rama Scales, PA-C   nystatin (MYCOSTATIN) 100000 UNIT/ML suspension Take 5 mLs (500,000 Units total) by mouth 4 (four)  times daily. 60 mL Theadora Rama Scales, PA-C      PDMP not reviewed this encounter.  Pending results:   Labs Reviewed - No data to display    Discharge Instructions      Please read below to learn more about the medications, dosages and frequencies that I recommend to help alleviate your symptoms and to get you feeling better soon:   Z-Pak (azithromycin):  Please take one (1) dose daily for 3 days.  This antibiotic can cause upset stomach, this will resolve once antibiotics are complete.  You are welcome to take a probiotic, eat yogurt, take Imodium while taking this medication.  Please avoid other systemic medications such as Maalox, Pepto-Bismol or milk of magnesia as they can interfere with the body's ability to absorb the antibiotics.   Diflucan (fluconazole): Taking antibiotics can often cause patients to develop a vaginal yeast infection.  For this reason, I have provided you with a prescription for Diflucan, and antifungal medication used to treat vaginal yeast infections.  Please take the first Diflucan tablet on day 3 or 4 of your antibiotic therapy, and take the second Diflucan tablet 3 days later.  You do not need to pick up this prescription or take this medication unless you develop symptoms of vaginal yeast infection including thick, white vaginal discharge and/or vaginal itching.  This prescription has been provided as a Research officer, political party and for your convenience.  I also provided you with some nystatin mouthwash just in case you developed thrush.       ProAir, Ventolin, Proventil (albuterol): This inhaled medication contains a short acting beta agonist bronchodilator.  This medication works on the smooth muscle that opens and constricts of your airways by relaxing the muscle.  The result of relaxation of the smooth muscle is increased air movement and improved work of breathing.  This is a short acting medication that can be used every 4-6 hours as needed for increased work of breathing, shortness of breath, wheezing and excessive coughing.     Flovent (fluticasone): Please inhale 2 puffs twice  daily.  This inhaled medication contains a corticosteroid.  The inhaled steroid and this medication  is not absorbed into the body and will not cause side effects such as increased blood sugar levels, irritability, sleeplessness or weight gain.  Inhaled corticosteroid are sort of like topical steroid creams but, as you can imagine, it is not practical to attempt to rub a steroid cream inside of your lungs.  Please use your albuterol inhaler right before using this medication.  Robitussin, Mucinex (guaifenesin): This is a daytime expectorant.  This single symptom reliever helps break up chest congestion and loosen up thick nasal drainage making phlegm and drainage easier to cough up and to blow out from your nose.  I recommend taking 400 mg in either liquid or tablet form three times daily as needed.  I do not recommend the 12-hour extended relief version or doses higher than 400 mg per each dose as these often make some patients feel jittery or jumpy and can interfere with sleep.  I also do not recommend that you purchase guaifenesin with the ingredient " DM" which is dextromethorphan, a cough suppressant which I only recommend taking at bedtime.  Guaifenesin 400 mg is a safe dose for people who are being treated for high blood pressure.     Promethazine DM: Promethazine is both a nasal decongestant that dries up mucous membranes and an antinausea medication.  Promethazine often makes most patients feel  fairly sleepy.  "DM" is dextromethorphan, a single symptom reliever which is a cough suppressant found in many over-the-counter cough medications and combination cold preparations.  Please take 5 mL before bedtime to minimize your cough which will help you sleep better.  I have sent a prescription for this medication to your pharmacy because it cannot be purchased over-the-counter.   If symptoms have not meaningfully improved in the next 10 to 14 days, please return for repeat evaluation or follow-up with your  regular provider.  If symptoms have worsened in the next 3 to 5 days, please go to the emergency room for further evaluation.    Thank you for visiting urgent care today.  We appreciate the opportunity to participate in your care.       Disposition Upon Discharge:  Condition: stable for discharge home  Patient presented with an acute illness with associated systemic symptoms and significant discomfort requiring urgent management. In my opinion, this is a condition that a prudent lay person (someone who possesses an average knowledge of health and medicine) may potentially expect to result in complications if not addressed urgently such as respiratory distress, impairment of bodily function or dysfunction of bodily organs.   Routine symptom specific, illness specific and/or disease specific instructions were discussed with the patient and/or caregiver at length.   As such, the patient has been evaluated and assessed, work-up was performed and treatment was provided in alignment with urgent care protocols and evidence based medicine.  Patient/parent/caregiver has been advised that the patient may require follow up for further testing and treatment if the symptoms continue in spite of treatment, as clinically indicated and appropriate.  Patient/parent/caregiver has been advised to return to the [facility] or PCP if no better; to PCP or the Emergency Department if new signs and symptoms develop, or if the current signs or symptoms continue to change or worsen for further workup, evaluation and treatment as clinically indicated and appropriate  The patient will follow up with their current PCP if and as advised. If the patient does not currently have a PCP we will assist them in obtaining one.   The patient may need specialty follow up if the symptoms continue, in spite of conservative treatment and management, for further workup, evaluation, consultation and treatment as clinically indicated and  appropriate.  Patient/parent/caregiver verbalized understanding and agreement of plan as discussed.  All questions were addressed during visit.  Please see discharge instructions below for further details of plan.  This office note has been dictated using Teaching laboratory technician.  Unfortunately, this method of dictation can sometimes lead to typographical or grammatical errors.  I apologize for your inconvenience in advance if this occurs.  Please do not hesitate to reach out to me if clarification is needed.      Theadora Rama Scales, PA-C 03/26/23 1025

## 2023-03-25 NOTE — Discharge Instructions (Addendum)
Please read below to learn more about the medications, dosages and frequencies that I recommend to help alleviate your symptoms and to get you feeling better soon:   Z-Pak (azithromycin):  Please take one (1) dose daily for 3 days.  This antibiotic can cause upset stomach, this will resolve once antibiotics are complete.  You are welcome to take a probiotic, eat yogurt, take Imodium while taking this medication.  Please avoid other systemic medications such as Maalox, Pepto-Bismol or milk of magnesia as they can interfere with the body's ability to absorb the antibiotics.   Diflucan (fluconazole): Taking antibiotics can often cause patients to develop a vaginal yeast infection.  For this reason, I have provided you with a prescription for Diflucan, and antifungal medication used to treat vaginal yeast infections.  Please take the first Diflucan tablet on day 3 or 4 of your antibiotic therapy, and take the second Diflucan tablet 3 days later.  You do not need to pick up this prescription or take this medication unless you develop symptoms of vaginal yeast infection including thick, white vaginal discharge and/or vaginal itching.  This prescription has been provided as a Research officer, political party and for your convenience.  I also provided you with some nystatin mouthwash just in case you developed thrush.       ProAir, Ventolin, Proventil (albuterol): This inhaled medication contains a short acting beta agonist bronchodilator.  This medication works on the smooth muscle that opens and constricts of your airways by relaxing the muscle.  The result of relaxation of the smooth muscle is increased air movement and improved work of breathing.  This is a short acting medication that can be used every 4-6 hours as needed for increased work of breathing, shortness of breath, wheezing and excessive coughing.     Flovent (fluticasone): Please inhale 2 puffs twice daily.  This inhaled medication contains a corticosteroid.  The inhaled  steroid and this medication  is not absorbed into the body and will not cause side effects such as increased blood sugar levels, irritability, sleeplessness or weight gain.  Inhaled corticosteroid are sort of like topical steroid creams but, as you can imagine, it is not practical to attempt to rub a steroid cream inside of your lungs.  Please use your albuterol inhaler right before using this medication.  Robitussin, Mucinex (guaifenesin): This is a daytime expectorant.  This single symptom reliever helps break up chest congestion and loosen up thick nasal drainage making phlegm and drainage easier to cough up and to blow out from your nose.  I recommend taking 400 mg in either liquid or tablet form three times daily as needed.  I do not recommend the 12-hour extended relief version or doses higher than 400 mg per each dose as these often make some patients feel jittery or jumpy and can interfere with sleep.  I also do not recommend that you purchase guaifenesin with the ingredient " DM" which is dextromethorphan, a cough suppressant which I only recommend taking at bedtime.  Guaifenesin 400 mg is a safe dose for people who are being treated for high blood pressure.     Promethazine DM: Promethazine is both a nasal decongestant that dries up mucous membranes and an antinausea medication.  Promethazine often makes most patients feel fairly sleepy.  "DM" is dextromethorphan, a single symptom reliever which is a cough suppressant found in many over-the-counter cough medications and combination cold preparations.  Please take 5 mL before bedtime to minimize your cough which will help you  sleep better.  I have sent a prescription for this medication to your pharmacy because it cannot be purchased over-the-counter.   If symptoms have not meaningfully improved in the next 10 to 14 days, please return for repeat evaluation or follow-up with your regular provider.  If symptoms have worsened in the next 3 to 5 days,  please go to the emergency room for further evaluation.    Thank you for visiting urgent care today.  We appreciate the opportunity to participate in your care.

## 2023-03-25 NOTE — ED Triage Notes (Signed)
Patient complains of cough and sore throat.  Onset 1 - 1 1/2 weeks ago.    Reports chest is sore, sinus draining.  Patient is concerned for thrush and the cause.    Patient has taken mucinex

## 2023-04-06 DIAGNOSIS — I739 Peripheral vascular disease, unspecified: Secondary | ICD-10-CM | POA: Diagnosis not present

## 2023-04-06 DIAGNOSIS — R221 Localized swelling, mass and lump, neck: Secondary | ICD-10-CM | POA: Diagnosis not present

## 2023-04-06 DIAGNOSIS — J208 Acute bronchitis due to other specified organisms: Secondary | ICD-10-CM | POA: Diagnosis not present

## 2023-04-06 DIAGNOSIS — I700 Atherosclerosis of aorta: Secondary | ICD-10-CM | POA: Diagnosis not present

## 2023-04-06 DIAGNOSIS — K219 Gastro-esophageal reflux disease without esophagitis: Secondary | ICD-10-CM | POA: Diagnosis not present

## 2023-04-06 DIAGNOSIS — J45998 Other asthma: Secondary | ICD-10-CM | POA: Diagnosis not present

## 2023-04-06 DIAGNOSIS — I119 Hypertensive heart disease without heart failure: Secondary | ICD-10-CM | POA: Diagnosis not present

## 2023-04-06 DIAGNOSIS — E669 Obesity, unspecified: Secondary | ICD-10-CM | POA: Diagnosis not present

## 2023-04-06 DIAGNOSIS — E039 Hypothyroidism, unspecified: Secondary | ICD-10-CM | POA: Diagnosis not present

## 2023-04-06 DIAGNOSIS — E1142 Type 2 diabetes mellitus with diabetic polyneuropathy: Secondary | ICD-10-CM | POA: Diagnosis not present

## 2023-04-06 DIAGNOSIS — E785 Hyperlipidemia, unspecified: Secondary | ICD-10-CM | POA: Diagnosis not present

## 2023-04-06 DIAGNOSIS — B9689 Other specified bacterial agents as the cause of diseases classified elsewhere: Secondary | ICD-10-CM | POA: Diagnosis not present

## 2023-04-09 DIAGNOSIS — I700 Atherosclerosis of aorta: Secondary | ICD-10-CM | POA: Diagnosis not present

## 2023-04-09 DIAGNOSIS — M1990 Unspecified osteoarthritis, unspecified site: Secondary | ICD-10-CM | POA: Diagnosis not present

## 2023-04-09 DIAGNOSIS — E039 Hypothyroidism, unspecified: Secondary | ICD-10-CM | POA: Diagnosis not present

## 2023-04-09 DIAGNOSIS — I10 Essential (primary) hypertension: Secondary | ICD-10-CM | POA: Diagnosis not present

## 2023-04-09 DIAGNOSIS — E669 Obesity, unspecified: Secondary | ICD-10-CM | POA: Diagnosis not present

## 2023-04-09 DIAGNOSIS — J4489 Other specified chronic obstructive pulmonary disease: Secondary | ICD-10-CM | POA: Diagnosis not present

## 2023-04-09 DIAGNOSIS — E876 Hypokalemia: Secondary | ICD-10-CM | POA: Diagnosis not present

## 2023-04-09 DIAGNOSIS — E785 Hyperlipidemia, unspecified: Secondary | ICD-10-CM | POA: Diagnosis not present

## 2023-04-09 DIAGNOSIS — K219 Gastro-esophageal reflux disease without esophagitis: Secondary | ICD-10-CM | POA: Diagnosis not present

## 2023-04-09 DIAGNOSIS — R7303 Prediabetes: Secondary | ICD-10-CM | POA: Diagnosis not present

## 2023-04-09 DIAGNOSIS — J309 Allergic rhinitis, unspecified: Secondary | ICD-10-CM | POA: Diagnosis not present

## 2023-04-09 DIAGNOSIS — Z87891 Personal history of nicotine dependence: Secondary | ICD-10-CM | POA: Diagnosis not present

## 2023-05-01 DIAGNOSIS — Z1231 Encounter for screening mammogram for malignant neoplasm of breast: Secondary | ICD-10-CM | POA: Diagnosis not present

## 2023-05-14 DIAGNOSIS — M8588 Other specified disorders of bone density and structure, other site: Secondary | ICD-10-CM | POA: Diagnosis not present

## 2023-05-14 DIAGNOSIS — E2839 Other primary ovarian failure: Secondary | ICD-10-CM | POA: Diagnosis not present

## 2023-05-14 DIAGNOSIS — N958 Other specified menopausal and perimenopausal disorders: Secondary | ICD-10-CM | POA: Diagnosis not present

## 2023-06-01 DIAGNOSIS — K115 Sialolithiasis: Secondary | ICD-10-CM | POA: Diagnosis not present

## 2023-06-01 DIAGNOSIS — K148 Other diseases of tongue: Secondary | ICD-10-CM | POA: Diagnosis not present

## 2023-06-01 DIAGNOSIS — L304 Erythema intertrigo: Secondary | ICD-10-CM | POA: Diagnosis not present

## 2023-06-08 ENCOUNTER — Other Ambulatory Visit (HOSPITAL_COMMUNITY): Payer: Self-pay

## 2023-06-10 DIAGNOSIS — K115 Sialolithiasis: Secondary | ICD-10-CM | POA: Diagnosis not present

## 2023-06-10 DIAGNOSIS — K1120 Sialoadenitis, unspecified: Secondary | ICD-10-CM | POA: Diagnosis not present

## 2023-06-10 DIAGNOSIS — E039 Hypothyroidism, unspecified: Secondary | ICD-10-CM | POA: Diagnosis not present

## 2023-06-10 DIAGNOSIS — J301 Allergic rhinitis due to pollen: Secondary | ICD-10-CM | POA: Diagnosis not present

## 2023-06-10 DIAGNOSIS — E119 Type 2 diabetes mellitus without complications: Secondary | ICD-10-CM | POA: Diagnosis not present

## 2023-06-10 DIAGNOSIS — B370 Candidal stomatitis: Secondary | ICD-10-CM | POA: Diagnosis not present

## 2023-06-10 DIAGNOSIS — I10 Essential (primary) hypertension: Secondary | ICD-10-CM | POA: Diagnosis not present

## 2023-06-10 DIAGNOSIS — K219 Gastro-esophageal reflux disease without esophagitis: Secondary | ICD-10-CM | POA: Diagnosis not present

## 2023-06-10 DIAGNOSIS — Z79899 Other long term (current) drug therapy: Secondary | ICD-10-CM | POA: Diagnosis not present

## 2023-06-10 DIAGNOSIS — B3781 Candidal esophagitis: Secondary | ICD-10-CM | POA: Diagnosis not present

## 2023-06-10 DIAGNOSIS — Z7984 Long term (current) use of oral hypoglycemic drugs: Secondary | ICD-10-CM | POA: Diagnosis not present

## 2023-06-10 DIAGNOSIS — Z87891 Personal history of nicotine dependence: Secondary | ICD-10-CM | POA: Diagnosis not present

## 2023-06-14 ENCOUNTER — Other Ambulatory Visit (HOSPITAL_COMMUNITY): Payer: Self-pay

## 2023-06-14 ENCOUNTER — Other Ambulatory Visit: Payer: Self-pay

## 2023-06-14 MED ORDER — ROSUVASTATIN CALCIUM 5 MG PO TABS
5.0000 mg | ORAL_TABLET | ORAL | 11 refills | Status: AC
  Filled 2023-06-14: qty 10, 35d supply, fill #0
  Filled 2023-07-19: qty 10, 35d supply, fill #1
  Filled 2023-09-24: qty 10, 35d supply, fill #2
  Filled 2023-10-28: qty 10, 35d supply, fill #3
  Filled 2023-12-24: qty 10, 35d supply, fill #4

## 2023-06-14 MED ORDER — MONTELUKAST SODIUM 10 MG PO TABS
10.0000 mg | ORAL_TABLET | Freq: Every day | ORAL | 2 refills | Status: AC
  Filled 2023-06-14: qty 100, 100d supply, fill #0
  Filled 2023-09-24: qty 100, 100d supply, fill #1
  Filled 2023-12-24: qty 100, 100d supply, fill #2

## 2023-06-14 MED ORDER — ONETOUCH DELICA PLUS LANCET33G MISC
3 refills | Status: AC
  Filled 2023-06-14: qty 100, 100d supply, fill #0
  Filled 2023-09-24: qty 100, 100d supply, fill #1
  Filled 2024-01-01: qty 100, 100d supply, fill #2

## 2023-06-14 MED ORDER — ZOLPIDEM TARTRATE 5 MG PO TABS
5.0000 mg | ORAL_TABLET | Freq: Every evening | ORAL | 3 refills | Status: DC | PRN
  Filled 2023-06-14: qty 90, 90d supply, fill #0
  Filled 2023-09-10: qty 90, 90d supply, fill #1

## 2023-06-14 MED ORDER — BLOOD GLUCOSE MONITOR SYSTEM W/DEVICE KIT
PACK | 1 refills | Status: AC
  Filled 2023-06-14: qty 1, 30d supply, fill #0
  Filled 2023-09-10 – 2023-09-13 (×3): qty 1, 30d supply, fill #1

## 2023-06-14 MED ORDER — METFORMIN HCL 500 MG PO TABS
500.0000 mg | ORAL_TABLET | Freq: Two times a day (BID) | ORAL | 3 refills | Status: AC
  Filled 2023-06-14: qty 180, 90d supply, fill #0
  Filled 2023-09-10 – 2023-09-24 (×2): qty 180, 90d supply, fill #1
  Filled 2023-12-24: qty 180, 90d supply, fill #2

## 2023-06-14 MED ORDER — ONETOUCH VERIO VI STRP
ORAL_STRIP | 4 refills | Status: AC
  Filled 2023-06-14: qty 100, 100d supply, fill #0
  Filled 2023-09-24: qty 100, 100d supply, fill #1
  Filled 2024-01-01: qty 100, 100d supply, fill #2

## 2023-06-14 MED ORDER — OMEPRAZOLE 40 MG PO CPDR
40.0000 mg | DELAYED_RELEASE_CAPSULE | Freq: Two times a day (BID) | ORAL | 3 refills | Status: AC
  Filled 2023-06-14: qty 180, 90d supply, fill #0
  Filled 2023-09-10 – 2023-09-24 (×2): qty 180, 90d supply, fill #1
  Filled 2023-12-24: qty 180, 90d supply, fill #2

## 2023-06-14 MED ORDER — LEVOTHYROXINE SODIUM 50 MCG PO TABS
50.0000 ug | ORAL_TABLET | Freq: Every day | ORAL | 4 refills | Status: AC
  Filled 2023-06-14: qty 90, 90d supply, fill #0
  Filled 2023-09-10 – 2023-09-24 (×2): qty 90, 90d supply, fill #1
  Filled 2023-12-24: qty 90, 90d supply, fill #2

## 2023-06-14 MED ORDER — LOSARTAN POTASSIUM-HCTZ 100-12.5 MG PO TABS
1.0000 | ORAL_TABLET | Freq: Every day | ORAL | 4 refills | Status: AC
  Filled 2023-06-14: qty 90, 90d supply, fill #0
  Filled 2023-09-10 – 2023-09-24 (×2): qty 90, 90d supply, fill #1
  Filled 2023-12-24: qty 90, 90d supply, fill #2

## 2023-06-14 MED ORDER — POTASSIUM CHLORIDE ER 10 MEQ PO TBCR
10.0000 meq | EXTENDED_RELEASE_TABLET | Freq: Every day | ORAL | 4 refills | Status: AC
  Filled 2023-06-14: qty 90, 90d supply, fill #0
  Filled 2023-09-10 – 2023-09-24 (×2): qty 90, 90d supply, fill #1
  Filled 2023-12-24: qty 90, 90d supply, fill #2

## 2023-06-15 ENCOUNTER — Other Ambulatory Visit (HOSPITAL_COMMUNITY): Payer: Self-pay

## 2023-07-12 DIAGNOSIS — J45998 Other asthma: Secondary | ICD-10-CM | POA: Diagnosis not present

## 2023-07-12 DIAGNOSIS — R221 Localized swelling, mass and lump, neck: Secondary | ICD-10-CM | POA: Diagnosis not present

## 2023-07-12 DIAGNOSIS — I739 Peripheral vascular disease, unspecified: Secondary | ICD-10-CM | POA: Diagnosis not present

## 2023-07-12 DIAGNOSIS — I700 Atherosclerosis of aorta: Secondary | ICD-10-CM | POA: Diagnosis not present

## 2023-07-12 DIAGNOSIS — E039 Hypothyroidism, unspecified: Secondary | ICD-10-CM | POA: Diagnosis not present

## 2023-07-12 DIAGNOSIS — E538 Deficiency of other specified B group vitamins: Secondary | ICD-10-CM | POA: Diagnosis not present

## 2023-07-12 DIAGNOSIS — N898 Other specified noninflammatory disorders of vagina: Secondary | ICD-10-CM | POA: Diagnosis not present

## 2023-07-12 DIAGNOSIS — E1142 Type 2 diabetes mellitus with diabetic polyneuropathy: Secondary | ICD-10-CM | POA: Diagnosis not present

## 2023-07-12 DIAGNOSIS — I119 Hypertensive heart disease without heart failure: Secondary | ICD-10-CM | POA: Diagnosis not present

## 2023-07-12 DIAGNOSIS — E669 Obesity, unspecified: Secondary | ICD-10-CM | POA: Diagnosis not present

## 2023-07-12 DIAGNOSIS — R309 Painful micturition, unspecified: Secondary | ICD-10-CM | POA: Diagnosis not present

## 2023-07-12 DIAGNOSIS — E785 Hyperlipidemia, unspecified: Secondary | ICD-10-CM | POA: Diagnosis not present

## 2023-07-14 ENCOUNTER — Encounter: Payer: Self-pay | Admitting: Internal Medicine

## 2023-07-14 ENCOUNTER — Other Ambulatory Visit (HOSPITAL_COMMUNITY): Payer: Self-pay

## 2023-07-14 ENCOUNTER — Ambulatory Visit: Admitting: Internal Medicine

## 2023-07-14 VITALS — BP 128/78 | HR 75 | Ht 63.0 in | Wt 200.0 lb

## 2023-07-14 DIAGNOSIS — Z87891 Personal history of nicotine dependence: Secondary | ICD-10-CM

## 2023-07-14 DIAGNOSIS — R682 Dry mouth, unspecified: Secondary | ICD-10-CM

## 2023-07-14 DIAGNOSIS — J4520 Mild intermittent asthma, uncomplicated: Secondary | ICD-10-CM

## 2023-07-14 DIAGNOSIS — B370 Candidal stomatitis: Secondary | ICD-10-CM | POA: Diagnosis not present

## 2023-07-14 LAB — NITRIC OXIDE: Nitric Oxide: 29

## 2023-07-14 MED ORDER — DULERA 50-5 MCG/ACT IN AERO
2.0000 | INHALATION_SPRAY | Freq: Two times a day (BID) | RESPIRATORY_TRACT | 5 refills | Status: DC
  Filled 2023-07-14 – 2023-07-19 (×2): qty 13, 30d supply, fill #0

## 2023-07-14 NOTE — Patient Instructions (Addendum)
 It was a pleasure to see you today!  Please schedule follow up with myself in 1 year.  If my schedule is not open yet, we will contact you with a reminder closer to that time. Please call [phone] if you haven't heard from us  a month before, and always call us  sooner if issues or concerns arise. You can also send us  a message through MyChart, but but aware that this is not to be used for urgent issues and it may take up to 5-7 days to receive a reply. Please be aware that you will likely be able to view your results before I have a chance to respond to them. Please give us  5 business days to respond to any non-urgent results.    YOUR PLAN:  -Mild intermittent asthma: asthma or bronchitis is a inflammation of the airways in the lungs.  Yours is triggered when you have a respiratory infection or allergy flare. You do not need to use a daily inhaler, and can use only when you have symptoms of chest tightness, wheezing, coughing, shortness of breath. We will switch you to a Dulera inhaler during illness or when symptoms start. To reduce the risk of thrush, use a spacer and remember to gargle and rinse your mouth after using the inhaler.   -ALLERGIC RHINITIS: Allergic rhinitis is an allergic reaction that causes sneezing, congestion, and a runny nose. Continue taking Claritin  and use a mask when exposed to allergens like tree pollen to help manage your symptoms.  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 Discuss dry mouth management with your dentist, carry a water bottle to stay hydrated, and discontinue the Flovent  inhaler to reduce oral irritation and thrush risk.     Sometimes the inhalers we prescribe are either not covered or are too expensive.  In most cases your doctor can substitute an alternate inhaler which is covered under your insurance formulary.  Please call your insurance company  to find out which inhaler is covered and let us  know, and we can prescribe an alternative.  I am recommending starting an inhaler for your asthma called dulera. Because you do not have daily symptoms, using this inhaler on an as needed basis may be sufficient.  I recommend starting to use the inhaler daily as prescribed if you have symptoms of asthma such as chest tightness, wheezing, coughing, shortness of breath. You should also start using it if you have exposure to a sick contact, worsening allergies, or any other trigger for your asthma. I recommend you keep using it even after your respiratory symptoms resolve for 3-4 days. The goal of this therapy is to prevent your symptoms from becoming a flare severe enough to require steroids like prednisone .   Please call our office if using this inhaler on an as needed basis is not sufficient. You might need to be seen sooner than our scheduled follow up.

## 2023-07-14 NOTE — Progress Notes (Signed)
 Alexandria Walsh    [id number]    1950-12-27  Primary Care Physician:South, Mara Seminole, MD  Referring Physician: Rosslyn Coons, MD [address] [place],  [place] [address] Reason for Consultation: cough Date of Consultation: 07/14/2023  Chief complaint:   Chief Complaint  Patient presents with   Consult    Pt states ENT/ thrush- possibility of a sleep issues.      HPI: Discussed the use of AI scribe software for clinical note transcription with the patient, who gave verbal consent to proceed.  History of Present Illness Alexandria Walsh "Alexandria Walsh" is a 73 year old female with chronic bronchitis who presents with nighttime nasal congestion and breathing difficulties.  She experiences nasal congestion primarily at night, which leads to coughing and tongue swelling. These symptoms have persisted since her salivary gland removal. She uses nystatin  and Magic Mouthwash for temporary relief and brushes her tongue with baking soda after meals to manage swelling.  She has been experiencing breathing difficulties, particularly at night, for the past eight to nine months. She had two episodes of bronchitis earlier this year, which were treated with inhalers. Flovent  provided more relief than albuterol , but she is not currently using inhalers due to concerns about thrush. She has a history of allergies confirmed by skin prick testing and suspects allergies may trigger her breathing issues. She uses Claritin  for allergy management instead of Singulair , which was ineffective. A humidifier and air purifier are used at night to aid breathing.  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 She quit smoking five years ago after smoking lightly for several decades.  Family history includes younger relatives with allergies and asthma-like symptoms.   Social history:  Occupation:  retired, [employer] Services operational officer, worked in Audiological scientist Exposures: lives at home with daughter and 2 grandchildren Smoking history: <10 pack year smoking history quit 2020  Social History   Occupational History   Not on file  Tobacco Use   Smoking status: Former    Current packs/day: 0.00    Average packs/day: 0.2 packs/day for 44.6 years (8.9 ttl pk-yrs)    Types: Cigarettes    Start date: 77    Quit date: 09/18/2017    Years since quitting: 5.8    Passive exposure: Never   Smokeless tobacco: Never  Vaping Use   Vaping status: Never Used  Substance and Sexual Activity   Alcohol  use: Yes    Alcohol /week: 0.0 standard drinks of alcohol     Comment: occas   Drug use: No   Sexual activity: Not on file    Relevant family history:  Family History  Problem Relation Age of Onset   Hypertension Mother    Hypertension Father    Diabetes Maternal Aunt    Heart disease Maternal Uncle    Heart disease Other    Asthma Grandchild    Colon cancer Neg Hx    Colon polyps Neg Hx    Esophageal cancer Neg Hx    Rectal cancer Neg Hx    Stomach cancer Neg Hx     Past Medical History:  Diagnosis Date   Allergy    Anemia    Diabetes mellitus without complication (HCC)    Hypertension    Thyroid  disease     Past Surgical History:  Procedure Laterality Date   CESAREAN SECTION  1988 1991   COLONOSCOPY  at age 9   in [place]  [place]-normal exam per pt.    MYOMECTOMY  1987   SALIVARY GLAND SURGERY Left    removal     Physical Exam: Blood pressure 128/78, pulse 75, height 5\' 3"  (1.6 m), weight 200 lb (90.7 kg), SpO2 100%. Gen:      No acute distress ENT:  no thrush, mild nasal debris, no nasal polyps, mucus membranes moist Lungs:    No increased respiratory effort, symmetric chest wall excursion, clear to auscultation bilaterally, no wheezes or crackles CV:         Regular rate and rhythm; no murmurs, rubs, or gallops.  No pedal edema Abd:      + bowel sounds; soft, non-tender; no  distension MSK: no acute synovitis of DIP or PIP joints, no mechanics hands.  Skin:      Warm and dry; no rashes Neuro: normal speech, no focal facial asymmetry Psych: alert and oriented x3, normal mood and affect   Data Reviewed/Medical Decision Making:  Independent interpretation of tests: Imaging:  Review of patient's chest xray May 2020 images revealed no acute process. The patient's images have been independently reviewed by me.    PFTs: I have personally reviewed the patient's PFTs and      No data to display          Labs:  Lab Results  Component Value Date   NA 139 07/07/2019   K 3.6 07/07/2019   CO2 27 07/07/2019   GLUCOSE 119 (H) 07/07/2019   BUN 10 07/07/2019   CREATININE 0.82 07/07/2019   CALCIUM  9.3 07/07/2019   GFRNONAA >60 07/07/2019   Lab Results  Component Value Date   WBC 9.6 07/07/2019   HGB 13.6 07/07/2019   HCT 42.3 07/07/2019   MCV 91.6 07/07/2019   PLT 202 07/07/2019     Immunization status:  Immunization History  Administered Date(s) Administered   Influenza,inj,Quad PF,6+ Mos 12/14/2015   PFIZER(Purple Top)SARS-COV-2 Vaccination 04/03/2019, 04/24/2019     I reviewed prior external note(s) from ENT at [facility]  I reviewed the result(s) of the labs and imaging as noted above.   I have ordered   Assessment and Plan Assessment & Plan Mild intermittent asthma Intermittent chest tightness, coughing, and shortness of breath improved with Flovent , suggesting episodic nature possibly triggered by allergies. Considered mild asthma or bronchitis due to steroid inhaler response. Discussed thrush risk with steroid inhaler use. - Switch to Dulera inhaler during illness or symptom onset. - Provide spacer to reduce thrush risk. - Instruct to gargle and rinse mouth after inhaler use. - Order pulmonary function test to assess for allergic inflammation.  Allergic rhinitis Allergic rhinitis with postnasal drainage, tree pollen allergy confirmed.  Symptoms managed with Claritin . Possible trigger for bronchitis episodes. Discussed allergen exposure prevention with mask use. - Continue Claritin . - Use mask when exposed to allergens like tree pollen.  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 No current thrush observed. Discussed oral hydration importance. - Discuss dry mouth management with dentist. - Encourage carrying a water bottle. - Discontinue Flovent  inhaler to reduce oral irritation and thrush risk.    Return to Care: Return in about 1 year (around 07/13/2024).  Louie Rover, MD Pulmonary and Critical Care Medicine [facility] Office:[phone]  CC: Rosslyn Coons, MD

## 2023-07-15 ENCOUNTER — Telehealth: Payer: Self-pay

## 2023-07-15 ENCOUNTER — Other Ambulatory Visit (HOSPITAL_COMMUNITY): Payer: Self-pay

## 2023-07-15 NOTE — Telephone Encounter (Signed)
*  Asthma/Allergy  Pharmacy Patient Advocate Encounter   Received notification from CoverMyMeds that prior authorization for Dulera 50-5MCG/ACT aerosol  is required/requested.   Insurance verification completed.   The patient is insured through [facility] ADVANTAGE/RX ADVANCE .   Per test claim:  Breo Ellipta is preferred by the insurance.  If suggested medication is appropriate, Please send in a new RX and discontinue this one. If not, please advise as to why it's not appropriate so that we may request a Prior Authorization. Please note, some preferred medications may still require a PA.  If the suggested medications have not been trialed and there are no contraindications to their use, the PA will not be submitted, as it will not be approved.   CMM Key: [id number]

## 2023-07-19 ENCOUNTER — Other Ambulatory Visit (HOSPITAL_COMMUNITY): Payer: Self-pay

## 2023-07-21 NOTE — Telephone Encounter (Signed)
 Called and made patient aware of PA needed for Dulera  and that the preferred medication is Breo.  She said she never started on the Dulera  because it was not covered by her insurance.  I let her know that I would send a message to Dr. Dione Franks and once she review's the request, she will send in the alternative medication to her pharmacy.  She verbalized understanding.  Dr. Dione Franks, Dulera  is not covered, Jodell Munda is the preferred inhaler for patient's insurance.  Please advise.  Thank you.

## 2023-07-22 ENCOUNTER — Other Ambulatory Visit (HOSPITAL_COMMUNITY): Payer: Self-pay

## 2023-07-22 MED ORDER — FLUTICASONE FUROATE-VILANTEROL 200-25 MCG/ACT IN AEPB: 1.0000 | INHALATION_SPRAY | Freq: Every day | RESPIRATORY_TRACT | 5 refills | Status: AC

## 2023-07-22 NOTE — Telephone Encounter (Signed)
 I have ordered Breo instead. 1 puff once daily, gargle after use.

## 2023-09-11 ENCOUNTER — Other Ambulatory Visit (HOSPITAL_COMMUNITY): Payer: Self-pay

## 2023-09-13 ENCOUNTER — Other Ambulatory Visit (HOSPITAL_COMMUNITY): Payer: Self-pay

## 2023-09-13 ENCOUNTER — Other Ambulatory Visit: Payer: Self-pay

## 2023-09-14 ENCOUNTER — Other Ambulatory Visit: Payer: Self-pay

## 2023-09-17 ENCOUNTER — Other Ambulatory Visit (HOSPITAL_COMMUNITY): Payer: Self-pay

## 2023-09-23 ENCOUNTER — Other Ambulatory Visit (HOSPITAL_BASED_OUTPATIENT_CLINIC_OR_DEPARTMENT_OTHER): Payer: Self-pay

## 2023-09-23 DIAGNOSIS — L259 Unspecified contact dermatitis, unspecified cause: Secondary | ICD-10-CM | POA: Diagnosis not present

## 2023-09-23 DIAGNOSIS — N9489 Other specified conditions associated with female genital organs and menstrual cycle: Secondary | ICD-10-CM | POA: Diagnosis not present

## 2023-09-23 DIAGNOSIS — L304 Erythema intertrigo: Secondary | ICD-10-CM | POA: Diagnosis not present

## 2023-09-23 MED ORDER — TRIAMCINOLONE ACETONIDE 0.1 % EX CREA
TOPICAL_CREAM | CUTANEOUS | 1 refills | Status: AC
Start: 2023-09-23 — End: ?
  Filled 2023-09-23: qty 30, 15d supply, fill #0
  Filled 2023-09-24 – 2023-10-28 (×2): qty 30, 15d supply, fill #1

## 2023-09-23 MED ORDER — CICLOPIROX 0.77 % EX GEL
CUTANEOUS | 0 refills | Status: DC
  Filled 2023-09-23: qty 30, 15d supply, fill #0

## 2023-09-24 ENCOUNTER — Other Ambulatory Visit: Payer: Self-pay

## 2023-09-24 ENCOUNTER — Other Ambulatory Visit (HOSPITAL_COMMUNITY): Payer: Self-pay

## 2023-10-28 ENCOUNTER — Other Ambulatory Visit (HOSPITAL_COMMUNITY): Payer: Self-pay

## 2023-11-25 DIAGNOSIS — Z6835 Body mass index (BMI) 35.0-35.9, adult: Secondary | ICD-10-CM | POA: Diagnosis not present

## 2023-11-25 DIAGNOSIS — Z124 Encounter for screening for malignant neoplasm of cervix: Secondary | ICD-10-CM | POA: Diagnosis not present

## 2023-11-25 DIAGNOSIS — N760 Acute vaginitis: Secondary | ICD-10-CM | POA: Diagnosis not present

## 2023-11-25 DIAGNOSIS — N771 Vaginitis, vulvitis and vulvovaginitis in diseases classified elsewhere: Secondary | ICD-10-CM | POA: Diagnosis not present

## 2023-11-25 DIAGNOSIS — Z01419 Encounter for gynecological examination (general) (routine) without abnormal findings: Secondary | ICD-10-CM | POA: Diagnosis not present

## 2023-12-01 ENCOUNTER — Other Ambulatory Visit (HOSPITAL_COMMUNITY): Payer: Self-pay

## 2023-12-01 DIAGNOSIS — Z1339 Encounter for screening examination for other mental health and behavioral disorders: Secondary | ICD-10-CM | POA: Diagnosis not present

## 2023-12-01 DIAGNOSIS — Z0000 Encounter for general adult medical examination without abnormal findings: Secondary | ICD-10-CM | POA: Diagnosis not present

## 2023-12-01 DIAGNOSIS — I119 Hypertensive heart disease without heart failure: Secondary | ICD-10-CM | POA: Diagnosis not present

## 2023-12-01 DIAGNOSIS — Z23 Encounter for immunization: Secondary | ICD-10-CM | POA: Diagnosis not present

## 2023-12-01 DIAGNOSIS — D126 Benign neoplasm of colon, unspecified: Secondary | ICD-10-CM | POA: Diagnosis not present

## 2023-12-01 DIAGNOSIS — I739 Peripheral vascular disease, unspecified: Secondary | ICD-10-CM | POA: Diagnosis not present

## 2023-12-01 DIAGNOSIS — K219 Gastro-esophageal reflux disease without esophagitis: Secondary | ICD-10-CM | POA: Diagnosis not present

## 2023-12-01 DIAGNOSIS — E785 Hyperlipidemia, unspecified: Secondary | ICD-10-CM | POA: Diagnosis not present

## 2023-12-01 DIAGNOSIS — E1142 Type 2 diabetes mellitus with diabetic polyneuropathy: Secondary | ICD-10-CM | POA: Diagnosis not present

## 2023-12-01 DIAGNOSIS — M8580 Other specified disorders of bone density and structure, unspecified site: Secondary | ICD-10-CM | POA: Diagnosis not present

## 2023-12-01 DIAGNOSIS — E039 Hypothyroidism, unspecified: Secondary | ICD-10-CM | POA: Diagnosis not present

## 2023-12-01 DIAGNOSIS — E538 Deficiency of other specified B group vitamins: Secondary | ICD-10-CM | POA: Diagnosis not present

## 2023-12-01 MED ORDER — ALBUTEROL SULFATE HFA 108 (90 BASE) MCG/ACT IN AERS
2.0000 | INHALATION_SPRAY | RESPIRATORY_TRACT | 4 refills | Status: AC | PRN
  Filled 2023-12-01: qty 6.7, 17d supply, fill #0

## 2023-12-02 ENCOUNTER — Other Ambulatory Visit: Payer: Self-pay

## 2023-12-24 ENCOUNTER — Other Ambulatory Visit (HOSPITAL_COMMUNITY): Payer: Self-pay

## 2023-12-24 ENCOUNTER — Other Ambulatory Visit: Payer: Self-pay

## 2024-01-01 ENCOUNTER — Other Ambulatory Visit (HOSPITAL_COMMUNITY): Payer: Self-pay

## 2024-01-03 ENCOUNTER — Other Ambulatory Visit (HOSPITAL_COMMUNITY): Payer: Self-pay

## 2024-01-03 ENCOUNTER — Other Ambulatory Visit: Payer: Self-pay

## 2024-01-03 MED ORDER — CICLOPIROX 0.77 % EX GEL
CUTANEOUS | 0 refills | Status: AC
  Filled 2024-01-03: qty 30, 30d supply, fill #0

## 2024-01-23 ENCOUNTER — Other Ambulatory Visit (HOSPITAL_COMMUNITY): Payer: Self-pay

## 2024-01-24 ENCOUNTER — Other Ambulatory Visit (HOSPITAL_COMMUNITY): Payer: Self-pay

## 2024-01-24 ENCOUNTER — Other Ambulatory Visit: Payer: Self-pay

## 2024-01-24 MED ORDER — ZOLPIDEM TARTRATE 5 MG PO TABS
5.0000 mg | ORAL_TABLET | Freq: Every evening | ORAL | 3 refills | Status: AC | PRN
  Filled 2024-01-24: qty 90, 90d supply, fill #0
